# Patient Record
Sex: Female | Born: 1935
Health system: Southern US, Community
[De-identification: ages and names within clinical notes are randomized; demographics above are authoritative.]

## PROBLEM LIST (undated history)

## (undated) DIAGNOSIS — I10 Essential (primary) hypertension: Secondary | ICD-10-CM

## (undated) DIAGNOSIS — K589 Irritable bowel syndrome without diarrhea: Secondary | ICD-10-CM

## (undated) HISTORY — PX: CHOLECYSTECTOMY: SHX55

## (undated) HISTORY — PX: ABDOMINAL HYSTERECTOMY: SHX81

## (undated) HISTORY — PX: TONSILLECTOMY: SUR1361

## (undated) HISTORY — PX: OTHER SURGICAL HISTORY: SHX169

---

## 2005-09-20 ENCOUNTER — Encounter: Admission: RE | Admit: 2005-09-20 | Discharge: 2005-09-20 | Payer: Self-pay | Admitting: Family Medicine

## 2005-10-18 ENCOUNTER — Inpatient Hospital Stay (HOSPITAL_COMMUNITY): Admission: EM | Admit: 2005-10-18 | Discharge: 2005-10-22 | Payer: Self-pay | Admitting: Emergency Medicine

## 2005-10-19 ENCOUNTER — Ambulatory Visit: Payer: Self-pay | Admitting: Cardiology

## 2005-10-19 ENCOUNTER — Encounter: Payer: Self-pay | Admitting: Cardiology

## 2005-11-13 ENCOUNTER — Ambulatory Visit: Payer: Self-pay | Admitting: Internal Medicine

## 2006-01-08 ENCOUNTER — Ambulatory Visit: Payer: Self-pay | Admitting: Internal Medicine

## 2009-01-10 ENCOUNTER — Encounter: Admission: RE | Admit: 2009-01-10 | Discharge: 2009-01-10 | Payer: Self-pay | Admitting: Emergency Medicine

## 2010-05-30 ENCOUNTER — Ambulatory Visit: Payer: Self-pay | Admitting: Family Medicine

## 2010-05-30 DIAGNOSIS — I10 Essential (primary) hypertension: Secondary | ICD-10-CM

## 2010-05-30 DIAGNOSIS — J45909 Unspecified asthma, uncomplicated: Secondary | ICD-10-CM | POA: Insufficient documentation

## 2010-05-30 DIAGNOSIS — K589 Irritable bowel syndrome without diarrhea: Secondary | ICD-10-CM

## 2010-05-31 LAB — CONVERTED CEMR LAB
ALT: 14 units/L (ref 0–35)
AST: 22 units/L (ref 0–37)
BUN: 18 mg/dL (ref 6–23)
CO2: 26 meq/L (ref 19–32)
Calcium: 8.9 mg/dL (ref 8.4–10.5)
Chloride: 104 meq/L (ref 96–112)
Glucose, Bld: 68 mg/dL — ABNORMAL LOW (ref 70–99)
Total Bilirubin: 0.4 mg/dL (ref 0.3–1.2)
Total Protein: 6.3 g/dL (ref 6.0–8.3)

## 2010-06-23 ENCOUNTER — Ambulatory Visit
Admission: RE | Admit: 2010-06-23 | Discharge: 2010-06-23 | Payer: Self-pay | Source: Home / Self Care | Attending: Family Medicine | Admitting: Family Medicine

## 2010-07-16 ENCOUNTER — Encounter: Payer: Self-pay | Admitting: Emergency Medicine

## 2010-07-20 ENCOUNTER — Ambulatory Visit
Admission: RE | Admit: 2010-07-20 | Discharge: 2010-07-20 | Payer: Self-pay | Source: Home / Self Care | Attending: Family Medicine | Admitting: Family Medicine

## 2010-07-20 DIAGNOSIS — H8110 Benign paroxysmal vertigo, unspecified ear: Secondary | ICD-10-CM | POA: Insufficient documentation

## 2010-07-25 ENCOUNTER — Telehealth: Payer: Self-pay | Admitting: Family Medicine

## 2010-07-25 NOTE — Assessment & Plan Note (Signed)
Summary: NOV: Get estab   Vital Signs:  Karen Ball profile:   77 year old female Height:      62 inches Weight:      115 pounds Pulse rate:   75 / minute BP sitting:   157 / 51  (right arm) Cuff size:   regular  Vitals Entered By: Avon Gully CMA, Duncan Dull) (May 30, 2010 10:12 AM) CC: NP-est care, Hypertension Management   CC:  NP-est care and Hypertension Management.  History of Present Illness: Karen Ball is coming here to establish care.  Her previous physician left the practice that she was going to.  Her daughter is also a Karen Ball here. Still drives and balances her check book. Last blood work was a year ago. Has a problem with losing her sodium.  overall she is in good health.  She's been on her current medication regimen for several years without any changes.  she does have a history of asthma.  Overall she says she does very well with it.  She typically only has symptoms in the fall and spring.  She uses Provera as her rescue inhaler and Proventil as her preventative inhaler.  She said she has not had to use either inhaler in quite some time.  Hypertension History:      She denies headache, chest pain, palpitations, dyspnea with exertion, orthopnea, PND, peripheral edema, visual symptoms, neurologic problems, syncope, and side effects from treatment.  She notes no problems with any antihypertensive medication side effects.        Positive major cardiovascular risk factors include female age 59 years old or older and hypertension.  Negative major cardiovascular risk factors include non-tobacco-user status.     Habits & Providers  Alcohol-Tobacco-Diet     Alcohol drinks/day: 0     Tobacco Status: never  Exercise-Depression-Behavior     Does Karen Ball Exercise: yes     Type of exercise: aerobics     Exercise (avg: min/session): <30     Times/week: 6     STD Risk: never     Drug Use: never     Seat Belt Use: always  Current Medications (verified): 1)  Benicar 20 Mg Tabs  (Olmesartan Medoxomil) .... Take One By Mouth Qd 2)  Evista 60 Mg Tabs (Raloxifene Hcl) .... Take 1 By Mouth Once Daily 3)  Pulmicort Flexhaler 180 Mcg/act Aepb (Budesonide) 4)  Oxytrol 3.9 Mg/24hr Pttw (Oxybutynin) .... Apply One Patch 1-2 Times Weekly 5)  Tgt Aspirin 81 Mg Tbec (Aspirin) .... Take One Tablet By Mouth Daily 6)  Womens Multivitamin Plus  Tabs (Multiple Vitamins-Minerals)  Allergies (verified): No Known Drug Allergies  Comments:  Nurse/Medical Assistant: The Karen Ball's medications and allergies were reviewed with the Karen Ball and were updated in the Medication and Allergy Lists. Avon Gully CMA, Duncan Dull) (May 30, 2010 10:15 AM)  Past History:  Past Medical History: wears bilateral hearing aids  Past Surgical History: Hysterectomy Cholecystectomy tonsillectomy Cataracts, bilat.   Family History: unknown  Social History: Lives alone. Has 2 children.   Smoking Status:  never Does Karen Ball Exercise:  yes STD Risk:  never Drug Use:  never Seat Belt Use:  always  Review of Systems       No fever/sweats/weakness, unexplained weight loss/gain.  No vison changes.  + difficulty hearing/ringing in ears, hay fever/allergies.  No chest pain/discomfort, palpitations.  No Br lump/nipple discharge.  No cough/wheeze.  No blood in BM, nausea/vomiting/diarrhea.  No nighttime urination, leaking urine, unusual vaginal bleeding, discharge (penis  or vagina).  No muscle/joint pain. No rash, change in mole.  No HA, memory loss.  No anxiety, sleep d/o, depression.  No easy bruising/bleeding, unexplained lump   Physical Exam  General:  Well-developed,well-nourished,in no acute distress; alert,appropriate and cooperative throughout examination.  She appears younger than stated age. Head:  Normocephalic and atraumatic without obvious abnormalities. No apparent alopecia or balding. Eyes:  No corneal or conjunctival inflammation noted. EOMI. Perrla.  Neck:  No deformities,  masses, or tenderness noted.no thyromegaly Lungs:  Normal respiratory effort, chest expands symmetrically. Lungs are clear to auscultation, no crackles or wheezes. Heart:  Normal rate and regular rhythm. S1 and S2 normal without gallop, murmur, click, rub or other extra sounds.no carotid or abdominal bruits Abdomen:  Bowel sounds positive,abdomen soft and non-tender without masses, organomegaly or hernias noted. Pulses:  radial 2+ bilaterally Extremities:  no lower extremity edema Neurologic:  gait normal and DTRs symmetrical and normal.   Skin:  no rashes.   Cervical Nodes:  No lymphadenopathy noted Psych:  Cognition and judgment appear intact. Alert and cooperative with normal attention span and concentration. No apparent delusions, illusions, hallucinations   Impression & Recommendations:  Problem # 1:  HYPERTENSION, BENIGN (ICD-401.1) her blood pressure is a little elevated today.  She senses where she normally runs.  I am not going to adjust her medication as her diastolic blood pressures are in the leg.  She is to recheck her creatinine and potassium level. Her updated medication list for this problem includes:    Benicar 20 Mg Tabs (Olmesartan medoxomil) .Marland Kitchen... Take one by mouth qd  Orders: T-Comprehensive Metabolic Panel ([id number])  Problem # 2:  HYPONATREMIA (ICD-276.1) she does have a history of hyponatremia so I would like to recheck her baseline today. Orders: T-Comprehensive Metabolic Panel ([id number])  Problem # 3:  ASTHMA, UNSPECIFIED, UNSPECIFIED STATUS (ICD-493.90) her asthma stable and her inhalers are up-to-date. Her updated medication list for this problem includes:    Pulmicort Flexhaler 180 Mcg/act Aepb (Budesonide)    Proair Hfa 108 (90 Base) Mcg/act Aers (Albuterol sulfate) .Marland Kitchen... 2 puff eveyr 4 hours as needed shortness of breath  Complete Medication List: 1)  Benicar 20 Mg Tabs (Olmesartan medoxomil) .... Take one by mouth qd 2)  Evista 60 Mg Tabs  (Raloxifene hcl) .... Take 1 by mouth once daily 3)  Pulmicort Flexhaler 180 Mcg/act Aepb (Budesonide) 4)  Oxytrol 3.9 Mg/24hr Pttw (Oxybutynin) .... Apply one patch 1-2 times weekly 5)  Tgt Aspirin 81 Mg Tbec (Aspirin) .... Take one tablet by mouth daily 6)  Womens Multivitamin Plus Tabs (Multiple vitamins-minerals) 7)  Proair Hfa 108 (90 Base) Mcg/act Aers (Albuterol sulfate) .... 2 puff eveyr 4 hours as needed shortness of breath  Hypertension Assessment/Plan:      The Karen Ball's hypertensive risk group is category B: At least one risk factor (excluding diabetes) with no target organ damage.  Today's blood pressure is 157/51.    Karen Ball Instructions: 1)  Please schedule a follow-up appointment in 6 months for blood pressure and labs.  Prescriptions: EVISTA 60 MG TABS (RALOXIFENE HCL) take 1 by mouth once daily  #90 x 3   Entered and Authorized by:   Nani Gasser MD   Signed by:   Nani Gasser MD on 05/30/2010   Method used:   Printed then faxed to ...       [facility] (retail)             , [place]  Ph: [phone]       Fax: [phone]   RxID:   [id number] BENICAR 20 MG TABS (OLMESARTAN MEDOXOMIL) take one by mouth qd  #90 x 3   Entered and Authorized by:   Nani Gasser MD   Signed by:   Nani Gasser MD on 05/30/2010   Method used:   Printed then faxed to ...       [facility] (retail)             , [place]         Ph: [phone]       Fax: [phone]   RxID:   [id number] OXYTROL 3.9 MG/24HR PTTW (OXYBUTYNIN) apply one patch 1-2 times weekly  #4 x 11   Entered and Authorized by:   Nani Gasser MD   Signed by:   Nani Gasser MD on 05/30/2010   Method used:   Electronically to        [facility]* (retail)       [address] [place], [place]  [address]       Ph: [phone]       Fax: [phone]   RxID:   [id number]    Orders Added: 1)  New Karen Ball Level III [06237] 2)  T-Comprehensive  Metabolic Panel [[id number]]   Immunization History:  Influenza Immunization History:    Influenza:  historical (03/25/2010)  Pneumovax Immunization History:    Pneumovax:  historical (06/25/2008)   Immunization History:  Influenza Immunization History:    Influenza:  Historical (03/25/2010)  Pneumovax Immunization History:    Pneumovax:  Historical (06/25/2008)   Immunization History:  Influenza Immunization History:    Influenza:  historical (03/25/2010)  Pneumovax Immunization History:    Pneumovax:  historical (06/25/2008)

## 2010-07-27 NOTE — Letter (Signed)
Summary: Records Dated 05-16-07 thru 11-08-09/Wolters MD  Records Dated 05-16-07 thru 11-08-09/Wolters MD   Imported By: Lanelle Bal 07/03/2010 11:01:27  _____________________________________________________________________  External Attachment:    Type:   Image     Comment:   External Document

## 2010-07-27 NOTE — Assessment & Plan Note (Signed)
Summary: BPV, HTN   Vital Signs:  Patient profile:   77 year old female Height:      62 inches Weight:      117 pounds Pulse rate:   76 / minute BP sitting:   186 / 79  (right arm) Cuff size:   regular  Vitals Entered By: Avon Gully CMA, Duncan Dull) (July 20, 2010 3:15 PM) CC: cant keep balance,vertigo CBG Result 168   Primary Care Provider:  Nani Gasser MD  CC:  cant keep balance and vertigo.  History of Present Illness: cant keep balance,vertigo. Started at 2 yesterday AM. Today it is worse.  Today is nauseated.  Feels like when getting ready to move feels she will loose balance. If sitting still feels better. Vertigo is horizaontal and vertical.  Last episode of vertigo was years ago. Loose bowels for 3 days but this is not unusual. NO fever or cold sxs.  Took a xanax this AM. Took a maalox this afternoon. NO ear pain or pressure. Some ringing in her esars but this is chronic. She does wear hearing aids as well. She is worried because she has felt very thirsty today.  Current Medications (verified): 1)  Benicar 20 Mg Tabs (Olmesartan Medoxomil) .... Take One By Mouth Qd 2)  Evista 60 Mg Tabs (Raloxifene Hcl) .... Take 1 By Mouth Once Daily 3)  Pulmicort Flexhaler 180 Mcg/act Aepb (Budesonide) 4)  Oxytrol 3.9 Mg/24hr Pttw (Oxybutynin) .... Apply One Patch 1-2 Times Weekly 5)  Tgt Aspirin 81 Mg Tbec (Aspirin) .... Take One Tablet By Mouth Daily 6)  Womens Multivitamin Plus  Tabs (Multiple Vitamins-Minerals) 7)  Proair Hfa 108 (90 Base) Mcg/act Aers (Albuterol Sulfate) .... 2 Puff Eveyr 4 Hours As Needed Shortness of Breath  Allergies (verified): No Known Drug Allergies  Comments:  Nurse/Medical Assistant: The patient's medications and allergies were reviewed with the patient and were updated in the Medication and Allergy Lists. Avon Gully CMA, Duncan Dull) (July 20, 2010 3:15 PM)  Social History: Lives alone. Has 2 children.  Her daughter lives her in town.  Retired.   Physical Exam  General:  Well-developed,well-nourished,in no acute distress; alert,appropriate and cooperative throughout examination Head:  Normocephalic and atraumatic without obvious abnormalities. No apparent alopecia or balding. Eyes:  No corneal or conjunctival inflammation noted. EOMI. Perrla. Ears:  Right TM and canal are clear.  Left is blocked by cerumen. I tried to removed this wax but it was very hard and pt was having discomfort.  Nose:  External nasal examination shows no deformity or inflammation.  Mouth:  Oral mucosa and oropharynx without lesions or exudates.  Teeth in good repair. Neck:  No deformities, masses, or tenderness noted. Lungs:  Normal respiratory effort, chest expands symmetrically. Lungs are clear to auscultation, no crackles or wheezes. Heart:  Normal rate and regular rhythm. S1 and S2 normal without gallop, murmur, click, rub or other extra sounds. Pulses:  Radial 2+  Neurologic:  Dizziness with Diks-hallpike tothe left (but not true nystagmus). alert & oriented X3, cranial nerves II-XII intact, and gait normal.   Skin:  no rashes.   Cervical Nodes:  No lymphadenopathy noted Psych:  Cognition and judgment appear intact. Alert and cooperative with normal attention span and concentration. No apparent delusions, illusions, hallucinations   Impression & Recommendations:  Problem # 1:  BENIGN POSITIONAL VERTIGO (ICD-386.11) Assessment New  Discussed dx. H.O. given and im injection of phenergan given REviewed how to do the exercises given.   F/U in one  week to make sure she is improving.  Can also irrigate her ear at that time.  Her updated medication list for this problem includes:    Meclizine Hcl 25 Mg Tabs (Meclizine hcl) .Marland Kitchen... Take 1 tablet by mouth two times a day as needed for vertigo.;    Zofran 4 Mg Tabs (Ondansetron hcl) ..... One tab under your tongued every 6 hours as needed nausea.  Orders: Fingerstick (36416) Glucose, (CBG)  (04540) Promethazine up to 50mg  (J2550) Admin of Injection (IM/SQ) (98119)  Problem # 2:  HYPERTENSION, BENIGN (ICD-401.1) Assessment: Deteriorated  BP up today likely secondary to nausea. She did vomit twice in the office.  Will recheck in one week. She says home BPs were in teh 140s today.  Her updated medication list for this problem includes:    Benicar 20 Mg Tabs (Olmesartan medoxomil) .Marland Kitchen... Take one by mouth qd  BP today: 186/79 Prior BP: 159/72 (06/23/2010)  Prior 10 Yr Risk Heart Disease: Not enough information (05/30/2010)  Labs Reviewed: K+: 4.3 (05/30/2010) Creat: : 1.18 (05/30/2010)     Complete Medication List: 1)  Benicar 20 Mg Tabs (Olmesartan medoxomil) .... Take one by mouth qd 2)  Evista 60 Mg Tabs (Raloxifene hcl) .... Take 1 by mouth once daily 3)  Pulmicort Flexhaler 180 Mcg/act Aepb (Budesonide) 4)  Oxytrol 3.9 Mg/24hr Pttw (Oxybutynin) .... Apply one patch 1-2 times weekly 5)  Tgt Aspirin 81 Mg Tbec (Aspirin) .... Take one tablet by mouth daily 6)  Womens Multivitamin Plus Tabs (Multiple vitamins-minerals) 7)  Proair Hfa 108 (90 Base) Mcg/act Aers (Albuterol sulfate) .... 2 puff eveyr 4 hours as needed shortness of breath 8)  Meclizine Hcl 25 Mg Tabs (Meclizine hcl) .... Take 1 tablet by mouth two times a day as needed for vertigo.; 9)  Zofran 4 Mg Tabs (Ondansetron hcl) .... One tab under your tongued every 6 hours as needed nausea.  Patient Instructions: 1)  Stay hydrated. Can use the zolfran if very nauseated 2)  The Meclizine is for dizziness as well.  3)  Also start the exercises tonight if able.  4)  Follow up in one week for vertigo, to wash you ear out and for your blood pressure.  Prescriptions: ZOFRAN 4 MG TABS (ONDANSETRON HCL) one tab under your tongued every 6 hours as needed nausea.  #8 x 0   Entered and Authorized by:   Nani Gasser MD   Signed by:   Nani Gasser MD on 07/20/2010   Method used:   Electronically to         [facility] (retail)       [address] [place], [place]  [address]       Ph: [phone]       Fax: [phone]   RxID:   [id number] MECLIZINE HCL 25 MG TABS (MECLIZINE HCL) Take 1 tablet by mouth two times a day as needed for vertigo.;  #20 x 0   Entered and Authorized by:   Nani Gasser MD   Signed by:   Nani Gasser MD on 07/20/2010   Method used:   Electronically to        [facility] (retail)       [address] [place], [place]  [address]       Ph: [phone]       Fax: [phone]   RxID:   [id number]    Medication  Administration  Injection # 1:    Medication: Promethazine up to 50mg     Diagnosis: BENIGN POSITIONAL VERTIGO (ICD-386.11)    Route: IM    Site: RUOQ gluteus    Exp Date: 01/24/2012    Lot #: [id number]    Mfr: [place]    Given by: Avon Gully CMA, Duncan Dull) (July 21, 2010 8:27 AM)  Orders Added: 1)  Fingerstick [36416] 2)  Glucose, (CBG) [82962] 3)  Est. Patient Level IV [46962] 4)  Promethazine up to 50mg  [J2550] 5)  Admin of Injection (IM/SQ) [95284] 6)  Est. Patient Level IV [13244]    Laboratory Results   Blood Tests   Date/Time Received: 07/20/10 Date/Time Reported: 07/20/10  CBG Random:: 168mg /dL

## 2010-07-27 NOTE — Assessment & Plan Note (Signed)
Summary: Sinsusitis   Vital Signs:  Patient profile:   77 year old female Height:      62 inches Weight:      115 pounds Temp:     98.6 degrees F oral Pulse rate:   56 / minute BP sitting:   159 / 72  (right arm) Cuff size:   regular  Vitals Entered By: Avon Gully CMA, Duncan Dull) (June 23, 2010 4:23 PM) CC: cough adn sinus drainagecough x 2 weeks   CC:  cough adn sinus drainagecough x 2 weeks.  History of Present Illness: cough adn sinus drainage cough x 2 weeks. No HA or facial pressure.  Just alot ofthick mucous from her nose and throat.  Some mild nosebleeds. No ear pain ore pressure. No GI sxs.  Using cough drops.  Worse when first lays down at night.  Then takes a cough drop and it quiets down for the night. Using nasal saline.   Current Medications (verified): 1)  Benicar 20 Mg Tabs (Olmesartan Medoxomil) .... Take One By Mouth Qd 2)  Evista 60 Mg Tabs (Raloxifene Hcl) .... Take 1 By Mouth Once Daily 3)  Pulmicort Flexhaler 180 Mcg/act Aepb (Budesonide) 4)  Oxytrol 3.9 Mg/24hr Pttw (Oxybutynin) .... Apply One Patch 1-2 Times Weekly 5)  Tgt Aspirin 81 Mg Tbec (Aspirin) .... Take One Tablet By Mouth Daily 6)  Womens Multivitamin Plus  Tabs (Multiple Vitamins-Minerals) 7)  Proair Hfa 108 (90 Base) Mcg/act Aers (Albuterol Sulfate) .... 2 Puff Eveyr 4 Hours As Needed Shortness of Breath  Allergies (verified): No Known Drug Allergies  Comments:  Nurse/Medical Assistant: The patient's medications and allergies were reviewed with the patient and were updated in the Medication and Allergy Lists. Avon Gully CMA, Duncan Dull) (June 23, 2010 4:23 PM)  Physical Exam  General:  Well-developed,well-nourished,in no acute distress; alert,appropriate and cooperative throughout examination Head:  Normocephalic and atraumatic without obvious abnormalities. No apparent alopecia or balding. Eyes:  No corneal or conjunctival inflammation noted. EOMI. Perrla.  Ears:  External ear  exam shows no significant lesions or deformities.  Otoscopic examination reveals clear canals, tympanic membranes are intact bilaterally without bulging, retraction, inflammation or discharge. Hearing is grossly normal bilaterally. Nose:  External nasal examination shows no deformity or inflammation. Nasal mucosa are pink and moist without lesions or exudates. Mouth:  Oral mucosa and oropharynx without lesions or exudates.   Neck:  No deformities, masses, or tenderness noted. Lungs:  Normal respiratory effort, chest expands symmetrically. Lungs are clear to auscultation, no crackles or wheezes. Heart:  Normal rate and regular rhythm. S1 and S2 normal without gallop, murmur, click, rub or other extra sounds. Skin:  no rashes.   Cervical Nodes:  No lymphadenopathy noted Psych:  Cognition and judgment appear intact. Alert and cooperative with normal attention span and concentration. No apparent delusions, illusions, hallucinations   Impression & Recommendations:  Problem # 1:  SINUSITIS - ACUTE-NOS (ICD-461.9) Can use Delsym and nasal saline for her sxs. Can start the abx and call if not better in one week.  Her updated medication list for this problem includes:    Amoxicillin 875 Mg Tabs (Amoxicillin) .Marland Kitchen... Take 1 tablet by mouth two times a day for 10 days  Instructed on treatment. Call if symptoms persist or worsen.   Complete Medication List: 1)  Benicar 20 Mg Tabs (Olmesartan medoxomil) .... Take one by mouth qd 2)  Evista 60 Mg Tabs (Raloxifene hcl) .... Take 1 by mouth once daily 3)  Pulmicort Flexhaler  180 Mcg/act Aepb (Budesonide) 4)  Oxytrol 3.9 Mg/24hr Pttw (Oxybutynin) .... Apply one patch 1-2 times weekly 5)  Tgt Aspirin 81 Mg Tbec (Aspirin) .... Take one tablet by mouth daily 6)  Womens Multivitamin Plus Tabs (Multiple vitamins-minerals) 7)  Proair Hfa 108 (90 Base) Mcg/act Aers (Albuterol sulfate) .... 2 puff eveyr 4 hours as needed shortness of breath 8)  Amoxicillin 875 Mg  Tabs (Amoxicillin) .... Take 1 tablet by mouth two times a day for 10 days  Patient Instructions: 1)  Call if not better in one week. Stay hydrated.  Prescriptions: AMOXICILLIN 875 MG TABS (AMOXICILLIN) Take 1 tablet by mouth two times a day for 10 days  #20 x 0   Entered and Authorized by:   Nani Gasser MD   Signed by:   Nani Gasser MD on 06/23/2010   Method used:   Electronically to        [facility] (retail)       [address] [place], [place]  [address]       Ph: [phone]       Fax: [phone]   RxID:   [id number]    Orders Added: 1)  Est. Patient Level III [84132] 2)  Est. Patient Level III [44010]

## 2010-07-31 ENCOUNTER — Ambulatory Visit: Payer: Self-pay | Admitting: Family Medicine

## 2010-08-01 ENCOUNTER — Ambulatory Visit: Payer: Self-pay | Admitting: Family Medicine

## 2010-08-02 NOTE — Progress Notes (Signed)
Summary: update on vertigo  ---- Converted from flag ---- ---- 07/21/2010 1:18 PM, Nani Gasser MD wrote: Will you call her and see if her vertigo is any better. ------------------------------  07/25/10 11:12 acm  called and left message on vm to call back with update 11:23 AM July 25, 2010 McCrim[place] CMA, Duncan Dull), Sue Lush pt's daughter called back and states she is feeling better, no nausea or vomiting or dizziness but feels very tired and weak but has good appetite

## 2010-08-03 ENCOUNTER — Ambulatory Visit (INDEPENDENT_AMBULATORY_CARE_PROVIDER_SITE_OTHER): Payer: MEDICARE | Admitting: Family Medicine

## 2010-08-03 ENCOUNTER — Encounter: Payer: Self-pay | Admitting: Family Medicine

## 2010-08-03 DIAGNOSIS — L259 Unspecified contact dermatitis, unspecified cause: Secondary | ICD-10-CM | POA: Insufficient documentation

## 2010-08-03 DIAGNOSIS — H6120 Impacted cerumen, unspecified ear: Secondary | ICD-10-CM | POA: Insufficient documentation

## 2010-08-03 DIAGNOSIS — H8110 Benign paroxysmal vertigo, unspecified ear: Secondary | ICD-10-CM

## 2010-08-03 DIAGNOSIS — K589 Irritable bowel syndrome without diarrhea: Secondary | ICD-10-CM

## 2010-08-10 NOTE — Assessment & Plan Note (Signed)
Summary: IBS, vertigo, rash, etc   Vital Signs:  Patient profile:   77 year old female Height:      62 inches Weight:      113 pounds Pulse rate:   84 / minute BP sitting:   142 / 70  (right arm) Cuff size:   regular  Vitals Entered By: Avon Gully CMA, Duncan Dull) (August 03, 2010 3:32 PM) CC: f/u vertigo, improved   Primary Care Provider:  Nani Gasser MD  CC:  f/u vertigo and improved.  History of Present Illness: f/u vertigo, improved. Vertigo has resolved.  Says still has some meds left. Want her ears washed out today.   IBS is what really upsets her.  Had multiple BMs yesterday. Says they are not loose or actual diarrhea just very frequent.  Then feels like everything races inside her after several BMs.  Pepto-Bismol helps her.  ONly 3 BMs today.  Sometimes Pepto BIsmol constipates.  Has had similar sxs for years. No real change.  Wants to make sure it is safe to use Pepto Bismol as needed .   She did bring in her copy of her home BPS.  She Didn't bring in home cuff.    Has a rash on the right side of her nose. Says it comes and goes.  Occ tender but not itching.  Usually resolves on its own but always returns on the same side. HAs been using hydrocortisone on it and says that usually gets it to resovle.   Her grandaughter is here with her today.   Current Medications (verified): 1)  Benicar 20 Mg Tabs (Olmesartan Medoxomil) .... Take One By Mouth Qd 2)  Evista 60 Mg Tabs (Raloxifene Hcl) .... Take 1 By Mouth Once Daily 3)  Pulmicort Flexhaler 180 Mcg/act Aepb (Budesonide) 4)  Oxytrol 3.9 Mg/24hr Pttw (Oxybutynin) .... Apply One Patch 1-2 Times Weekly 5)  Tgt Aspirin 81 Mg Tbec (Aspirin) .... Take One Tablet By Mouth Daily 6)  Womens Multivitamin Plus  Tabs (Multiple Vitamins-Minerals) 7)  Proair Hfa 108 (90 Base) Mcg/act Aers (Albuterol Sulfate) .... 2 Puff Eveyr 4 Hours As Needed Shortness of Breath 8)  Meclizine Hcl 25 Mg Tabs (Meclizine Hcl) .... Take 1 Tablet  By Mouth Two Times A Day As Needed For Vertigo.; 9)  Zofran 4 Mg Tabs (Ondansetron Hcl) .... One Tab Under Your Tongued Every 6 Hours As Needed Nausea.  Allergies (verified): No Known Drug Allergies  Comments:  Nurse/Medical Assistant: The patient's medications and allergies were reviewed with the patient and were updated in the Medication and Allergy Lists. Avon Gully CMA, Duncan Dull) (August 03, 2010 3:34 PM)  Past History:  Past Medical History: Last updated: 05/30/2010 wears bilateral hearing aids  Social History: Last updated: 07/20/2010 Lives alone. Has 2 children.  Her daughter lives her in town. Retired.   Physical Exam  General:  Well-developed,well-nourished,in no acute distress; alert,appropriate and cooperative throughout examination Head:  Normocephalic and atraumatic without obvious abnormalities. No apparent alopecia or balding. Eyes:  No corneal or conjunctival inflammation noted. EOMI. Perrla. Ears:  Left TM blocked by cerumen.  After irriation adn manual disimpaction the canal was cleared and the upper portion of her TM looks normal. Right TM and canal were clear.  Lungs:  Normal respiratory effort, chest expands symmetrically. Lungs are clear to auscultation, no crackles or wheezes. Heart:  Normal rate and regular rhythm. S1 and S2 normal without gallop, murmur, click, rub or other extra sounds. Skin:  3 erythematous  papules on teh right side of nose.     Impression & Recommendations:  Problem # 1:  BENIGN POSITIONAL VERTIGO (ICD-386.11) REsolved.   Can start her exercises if sxs return.  The following medications were removed from the medication list:    Meclizine Hcl 25 Mg Tabs (Meclizine hcl) .Marland Kitchen... Take 1 tablet by mouth two times a day as needed for vertigo.; Her updated medication list for this problem includes:    Zofran 4 Mg Tabs (Ondansetron hcl) ..... One tab under your tongued every 6 hours as needed nausea.  Problem # 2:  IBS  (ICD-564.1) Discussed eating more gradually to increase consistancy. If patterns out of the norm the norm then she need to call our office and we can refer to GI.  Also let her know occ use of Pepto Bismol is OK as long as not using daily.    Problem # 3:  HYPERTENSION, BENIGN (ICD-401.1)  Much better today. Home BPs range 102-190/ 49-64. Continue current regimen. I worry about dripping her BP too low though she really has large swings in her pressure.  Her updated medication list for this problem includes:    Benicar 20 Mg Tabs (Olmesartan medoxomil) .Marland Kitchen... Take one by mouth qd  BP today: 154/72 Prior BP: 186/79 (07/20/2010)  Prior 10 Yr Risk Heart Disease: Not enough information (05/30/2010)  Labs Reviewed: K+: 4.3 (05/30/2010) Creat: : 1.18 (05/30/2010)     Problem # 4:  DERMATITIS (ICD-692.9) The rash on her nose looks some like rosacea thought would be string to only be on one side of nose. Offered tx with metronidazole topical gel but pt preferred to use her hydrocortisone.  I just reminded her not to use for more than 2 weeks in a row, as needs to give her skin a break.  Prolonged use of steroids can cause acne like breakouts.   Problem # 5:  CERUMEN IMPACTION, LEFT (ICD-380.4) manually disimpacted and irrigated. Discussed using peroxide diluted with water twice a week to help soften the wax.   Complete Medication List: 1)  Benicar 20 Mg Tabs (Olmesartan medoxomil) .... Take one by mouth qd 2)  Evista 60 Mg Tabs (Raloxifene hcl) .... Take 1 by mouth once daily 3)  Pulmicort Flexhaler 180 Mcg/act Aepb (Budesonide) 4)  Oxytrol 3.9 Mg/24hr Pttw (Oxybutynin) .... Apply one patch 1-2 times weekly 5)  Tgt Aspirin 81 Mg Tbec (Aspirin) .... Take one tablet by mouth daily 6)  Womens Multivitamin Plus Tabs (Multiple vitamins-minerals) 7)  Proair Hfa 108 (90 Base) Mcg/act Aers (Albuterol sulfate) .... 2 puff eveyr 4 hours as needed shortness of breath 8)  Zofran 4 Mg Tabs (Ondansetron hcl)  .... One tab under your tongued every 6 hours as needed nausea. 9)  Pepto-bismol 262 Mg Tabs (Bismuth subsalicylate)  Patient Instructions: 1)  Please schedule a follow-up appointment in 3 months for blood pressure .    Orders Added: 1)  Est. Patient Level IV [16109]

## 2010-11-10 NOTE — H&P (Signed)
NAME:  MAKINLEY, MUSCATO NO.:  [id number]   MEDICAL RECORD NO.:  [id number]          PATIENT TYPE:  EMS   LOCATION:  MAJO                         FACILITY:  [facility]   PHYSICIAN:  Sherin Quarry, MD      DATE OF BIRTH:  Oct 31, 1917   DATE OF ADMISSION:  10/18/2005  DATE OF DISCHARGE:                                HISTORY & PHYSICAL   Karen Ball is an 77 year old lady who was formerly a resident of  [place].  She moved to [facility] to be close to her daughter in  October of last year.  According to the patient's daughter, about two months  ago, she had an episode of coughing and respiratory illness that was  attributed to the flu.  After that, she seemed to change in terms of her  personality, becoming increasing anxious, crying frequently, and exhibiting  a generalized tremor.  Over the last couple of weeks, her daughter has noted  increased ankle edema.  Today, she awakened complaining of nausea and said  that her chest felt heavy.  She also had a burning feeling in her throat.  She vomited on one occasion and had three loose stools.  She has vomited  again since she has been in the emergency room.   In the emergency room, her blood pressure was 144/46.  The pulse was 100.   Laboratory studies obtained included a hemoglobin of 12.1.  She was noted to  have a sodium of 115.   She is admitted at this time because of persistent nausea and vomiting as  well as for treatment of the hyponatremia.  She denies headache.  She is not  short of breath.  She does not have any abdominal pain.  She is not having  any dysuria.  She has not had any fevers or chills.   CURRENT MEDICATIONS:  1.  Lisinopril 10 mg daily.  2.  Norvasc 10 mg daily.  3.  Hydrochlorothiazide 25 mg daily.  4.  Xanax 0.25 mg p.r.n.  5.  Zoloft 25 mg daily.   She is allergic to IVP DYE.   PAST SURGICAL HISTORY:  She has had a previous cholecystectomy and cataract  operation.   PAST MEDICAL  HISTORY:  Hypertension.  Apparently, hypertension has been  diagnosed over the last three months.  She has gradually been placed on  increasing doses of blood pressure medicine.  Most recently,  hydrochlorothiazide, which was started a few days ago.  Her other medicines  are as detailed above.  Otherwise, she apparently has no history of heart  disease, stroke, COPD, kidney problems, etc.   FAMILY HISTORY:  Unremarkable.  The patient has a sister and a brother who  are both thought to be in good health.   SOCIAL HISTORY:  She lives by herself here in [place], but her daughter  lives close by.  She does not smoke.  She does not use alcohol.  Her  daughter thinks that she is quite anxious in general, possibly because of  the effects of moving to an unfamiliar location.   REVIEW  OF SYSTEMS:  Otherwise negative.   PHYSICAL EXAMINATION:  VITAL SIGNS:  Blood pressure 144/46, pulse 100,  respirations 24.  GENERAL:  The patient is a very anxious woman who has a generalized distal  tremor and is complaining of nausea.  HEENT:  Within normal limits.  CHEST:  Clear.  CARDIOVASCULAR:  Normal S1 and S2 without murmurs, rubs, or gallops.  ABDOMEN:  Benign.  Normal bowel sounds.  There are no masses, tenderness,  guarding or rebound.  NEUROLOGIC:  Remarkable only for the above-mentioned tremor.  EXTREMITIES:  Pretibial edema 1+.  The patient's daughter states that her  peripheral edema is much decreased from the last time that she saw Dr.  Julian Reil.   IMPRESSION:  1.  Persistent nausea and diarrhea.  2.  Hyponatremia.  3.  Hypertension.  4.  Increased anxiety and tremor x3 months.  5.  History of cholecystectomy.   PLAN:  We will give the patient intravenous fluids in the form of normal  saline and hold the hydrochlorothiazide.  We will follow her BMET closely.  In light of her complaints of chest pressure, we will obtain serial cardiac  enzymes to rule out MI.  In light of her recent  onset of tremor, we will  obtain a thyroid profile.  We will continue her anti-anxiety medications.  A  chest x-ray and KUB will be obtained.  Because of the hyponatremia, I am  going to check a cortisol level.  Will follow her closely in a hospital  setting.  This was discussed with her daughter.           ______________________________  Sherin Quarry, MD     SY/MEDQ  D:  10/18/2005  T:  10/18/2005  Job:  [id number]   cc:   Schuyler Amor, M.D.  Fax: [phone]

## 2010-11-10 NOTE — Assessment & Plan Note (Signed)
[facility]                               PULMONARY OFFICE NOTE   Karen Ball, Karen Ball                          MRN:          [id number]  DATE:01/08/2006                            DOB:          01-25-1918    PULMONARY/EXTENDED SUMMARY FOLLOWUP   HISTORY:  This is an 77 year old white female, who states she has had asthma  for years and never a smoker, was seen on May 22nd with difficulty  symptoms of dyspnea and cough that totally resolved off of ACE inhibitors  and also off of all asthma treatment.  She says in retrospect her problems  are worse in the spring and the fall, and of course it is the middle of the  summer and she is having no symptoms whatsoever.  Specifically, she denies  any nocturnal wheezing or cough or any nocturnal respiratory disturbances,  or significant dyspnea with activities of daily living, although she is  certainly not aerobically active.   Her medications were reviewed with her in detailed inventory.  The face  sheet today, January 08, 2006, no longer include ACE inhibitors.   PHYSICAL EXAMINATION:  VITAL SIGNS:  Her blood pressure is 130/74 with a  pulse rate of only 61 after taking her Benicar this morning.  GENERAL:  She is all smiles.  LUNGS:  Lung fields are perfectly clear bilaterally to auscultation and  percussion.  HEART:  Regular rate and rhythm with murmur, gallop, rub.  ABDOMEN:  Soft, benign.  EXTREMITIES:  Warm without calf tenderness, cyanosis, clubbing, edema.   PFTs are reviewed with her and her daughter and are basically normal.   IMPRESSION:  1.  Complete resolution of all of her asthma symptoms off of ACE      inhibitors and on Benicar.  2.  Benicar 20/12.5 one-half daily.  3.  I emphasized to her that this does not mean she does not have asthma.      In fact, during the spring and the fall, it may well be she is going to      notice increased symptoms of cough or wheeze for which she needs to be      able to use Albuterol p.r.n.  If she breaks the Rule of Twos of      Albuterol treatment, she needs to be maintained back on Azmacort during      her seasons but at this point I was unable to make a specific      recommendation as to when to start it and stop it, and would ask her to      monitor Albuterol use as an indicator whether Azmacort is needed.   I spent extra time going over all of these issues in as much detail as I  could for the patient and her daughter to summarize how difficult it is  sometimes to make a diagnosis of asthma and other problems, such as ACE  inhibitor intolerance, can easily be confused with typical asthma symptoms  and sometimes are indistinguishable unless using a trial and error  approach as was  done here.   I would be happy to see her back if on the other hand she notices increasing  symptoms off of ACE inhibitors, which are not easily controlled with  Azmacort with p.r.n. Albuterol, and I gave her specific scenarios under  which she should see me.  Otherwise, all of her care will be directed back  by her primary physicians at [facility].                                   Charlaine Dalton. Sherene Sires, MD, [facility]   MBW/MedQ  DD:  01/08/2006  DT:  01/08/2006  Job #:  [id number]   cc:   Schuyler Amor, MD

## 2010-11-10 NOTE — Discharge Summary (Signed)
[name], Ball NO.:  [id number]   MEDICAL RECORD NO.:  [id number]          PATIENT TYPE:  INP   LOCATION:  4707                         FACILITY:  [facility]   PHYSICIAN:  Jackie Plum, M.D.DATE OF BIRTH:  1917/10/16   DATE OF ADMISSION:  10/18/2005  DATE OF DISCHARGE:  10/22/2005                                 DISCHARGE SUMMARY   DISCHARGE DIAGNOSES:  1.  Nausea and diarrhea, resolved.  2.  Recurrent hyponatremia significantly improved (outpatient follow-up      recommended).  3.  Chronic obstructive pulmonary disease by chest x-ray.  4.  Questionable history of asthma.  5.  History of anxiety and tremors.  6.  Mild anemia, stable.  Outpatient follow-up recommended.   DISCHARGE MEDICATIONS:  The patient is going home to resume:  1.  Norvasc 10 mg daily.  2.  Lisinopril has been increased to 20 mg daily.  3.  Hydrochlorothiazide has been discontinued.  4.  She also continues her Azmacort as previously.  5.  She will also continue her Evista as previously.   CONSULTATIONS:  Not applicable.   PROCEDURE:  Not applicable.   CONDITION ON DISCHARGE:  Improved and satisfactory.   REASON FOR ADMISSION:  Severe hyponatremia.  The patient is a very pleasant  77 year old Caucasian lady who moved to [facility] to be close to her  daughter in October of last year and has been seen by Dr. Julian Reil of [facility].  She presents with increasing worsening of nausea and  weakness.  She also had some loose stools and vomited.  She was also noted  to have some generalized tremor at that time.  On admission, the patient was  noted to have very severe hyponatremia of 101.5.  Her HEENT is within normal  limits with normal cardiopulmonary auscultation.  She had some tremors,  otherwise neurologic testing was unremarkable.  She had 1+ pretibial edema.  On admission, the patient was started on slow IV fluid with normal saline  infusion.  Her hydrochlorothiazide  was held on account of concerns that this  may be contributing to her hyponatremia.  Her basic metabolic panels were  followed carefully and became negative for a myocardial infarction.  TSH was  checked which was within normal limits.  Cortisol level was also within  normal limits.  The patient's sodium increased to 118 and subsequently to  134 which remained so the next day with improvement and resolution of her  presenting symptoms.  After further discussion with the family, we realized  that the patient has had problems with sodium for a long while she was in  [place] and apparently has been investigated previously.  The patient  was seen also by PT and she did very well.  The patient was planned for  discharge home today.  On rounds today, the patient denies nausea, vomiting,  diarrhea, abdominal pain, dizziness, chest pain, shortness of breath, or any  fatigue.  Her blood pressure was 157/71, pulse 87, respirations 20,  temperature 98.7 degrees Fahrenheit, O2 saturation of 93% on room air.  She  is alert, appropriate, and pleasant.  Oropharynx moist with no evidence of  clinical dehydration.  Cardiopulmonary examination unremarkable.  Abdomen  was soft and nontender.  Extremities with no cyanosis.  She is being  discharged home for outpatient follow-up with her PCP in about 10-14 days.  At the outpatient follow-up visit, the patient will need to get metabolic  panel checked to follow up on her sodium to make sure the levels are in  acceptable range.  I have asked them to go ahead and get the patient's  medical records forwarded from previous PCP in [place] to Dr. Julian Reil  at [facility] and would need to find out if any workup has been  done for this previously and exactly what she has and she will need  continued follow-up of her sodium levels.  Also, the patient had mentioned a  history of asthma and never knew of any history of COPD, however, her x-ray  in the  hospital done on admission specifically October 18, 2005, indicated  COPD without any active disease.  We will need to look into previous  diagnosis of COPD or asthma and if this cannot be specifically verified,  then the patient may benefit from pulmonary function testing.  She has been  taking Azmacort which is good for her.  We tried her on Combivent, but she  got very anxious with that and therefore, we decided not to prescribe this  for her at this time since she's  fine, but this is something that needs to  be looked into during her outpatient visit.  The patient is discharged in  stable satisfactory condition.      Jackie Plum, M.D.  Electronically Signed     GO/MEDQ  D:  10/22/2005  T:  10/22/2005  Job:  [id number]   cc:   Schuyler Amor, M.D.  Fax: [phone]

## 2011-04-13 ENCOUNTER — Encounter: Payer: Self-pay | Admitting: Family Medicine

## 2011-04-18 ENCOUNTER — Ambulatory Visit (INDEPENDENT_AMBULATORY_CARE_PROVIDER_SITE_OTHER): Payer: Medicare Other | Admitting: Family Medicine

## 2011-04-18 VITALS — BP 158/70 | HR 63 | Wt 115.0 lb

## 2011-04-18 DIAGNOSIS — R946 Abnormal results of thyroid function studies: Secondary | ICD-10-CM

## 2011-04-18 DIAGNOSIS — E871 Hypo-osmolality and hyponatremia: Secondary | ICD-10-CM

## 2011-04-18 DIAGNOSIS — K5289 Other specified noninfective gastroenteritis and colitis: Secondary | ICD-10-CM

## 2011-04-18 DIAGNOSIS — K529 Noninfective gastroenteritis and colitis, unspecified: Secondary | ICD-10-CM

## 2011-04-18 NOTE — Progress Notes (Signed)
  Subjective:    Patient ID: Karen Ball, female    DOB: 1918/04/08, 77 y.o.   MRN: [id number]  HPI She is here for hospital followup today. She said about 2 weeks ago he she started having frequent stools. This was typical for her IBS. She says that it was not diarrhea there were well-formed but she had about 8 bowel movements during the day. Later that evening she vomited and went to the emergency department. They gave her IV hydration and discharged her home. She started vomiting again in the car on the way home. 2 days later she ended up going back to the emergency room after continuing to vomit and was admitted overnight. She was rehydrated as she was hyponatremic on admission. Since then she has been feeling much better. She says she still feels a little queasy but has not vomited. Her appetite is slowly improving. She's remained afebrile. In addition they noted that her TSH was borderline elevated per the report. I recommended rechecking her when she was feeling well. I did review her discharge summary. She has had multiple episodes of frequent stools and hyponatremia.    Review of Systems     Objective:   Physical Exam  Constitutional: She is oriented to person, place, and time. She appears well-developed and well-nourished.  HENT:  Head: Normocephalic and atraumatic.  Cardiovascular: Normal rate, regular rhythm and normal heart sounds.   Pulmonary/Chest: Effort normal and breath sounds normal.  Abdominal: Bowel sounds are normal. She exhibits no distension.  Neurological: She is alert and oriented to person, place, and time.  Skin: Skin is warm and dry.  Psychiatric: She has a normal mood and affect. Her behavior is normal.          Assessment & Plan:  Gastroenteritis-she seems to be improving. I would like to recheck her sodium today to make sure that she is stable. She was also given Zofran to use for nausea and vomiting at discharge and see says she has not had to use this  yet.  Abnormal thyroid level-recheck TSH today.  She is also at the Medicare gap and that we gave her samples of Benicar and Pulmicort today to help.

## 2011-04-19 ENCOUNTER — Telehealth: Payer: Self-pay | Admitting: *Deleted

## 2011-04-19 LAB — BASIC METABOLIC PANEL
BUN: 12 mg/dL (ref 6–23)
Calcium: 9.2 mg/dL (ref 8.4–10.5)
Creat: 1.24 mg/dL — ABNORMAL HIGH (ref 0.50–1.10)

## 2011-04-19 NOTE — Telephone Encounter (Signed)
Message copied by Wyline Beady on Thu Apr 19, 2011  9:26 AM ------      Message from: Nani Gasser D      Created: Thu Apr 19, 2011  6:32 AM       Her sodium is just borderline low. That's much better than when she was in the hospital. I encourage her to eat some salty foods and let's recheck in one week to make sure it is stable. Her thyroid is in the normal range. We can recheck in 6 months to make sure it is stable.

## 2011-04-19 NOTE — Telephone Encounter (Signed)
Left message with son-in law

## 2011-04-25 ENCOUNTER — Telehealth: Payer: Self-pay | Admitting: Family Medicine

## 2011-04-25 MED ORDER — ALPRAZOLAM 0.5 MG PO TABS: ORAL_TABLET | ORAL | Status: DC

## 2011-04-25 NOTE — Telephone Encounter (Signed)
Prescription printed. Will fax.

## 2011-04-25 NOTE — Telephone Encounter (Signed)
Pt returned call to our office from last week regarding her lab results.  Pt also said at her appt last week she and Dr. Linford Arnold had discussed med for her nerves and she had to rush down to the lab before they closed and she forgot to get a script for the nerve med (alprazolam). Plan:  Lab results were addressed with the pt and she understands to repeat in 6 mths.           Sent encounter to Dr. Linford Arnold to order nerve med since pt has not been on, and correct pharm is loaded in chart file.  Jarvis Newcomer, LPN [facility]

## 2011-05-15 ENCOUNTER — Encounter: Payer: Self-pay | Admitting: *Deleted

## 2011-05-15 ENCOUNTER — Emergency Department (INDEPENDENT_AMBULATORY_CARE_PROVIDER_SITE_OTHER)
Admission: EM | Admit: 2011-05-15 | Discharge: 2011-05-15 | Disposition: A | Discharge status: Home / Self Care | Payer: Medicare Other | Source: Home / Self Care | Attending: Emergency Medicine | Admitting: Emergency Medicine

## 2011-05-15 DIAGNOSIS — R300 Dysuria: Secondary | ICD-10-CM

## 2011-05-15 DIAGNOSIS — N390 Urinary tract infection, site not specified: Secondary | ICD-10-CM

## 2011-05-15 DIAGNOSIS — J069 Acute upper respiratory infection, unspecified: Secondary | ICD-10-CM

## 2011-05-15 DIAGNOSIS — J029 Acute pharyngitis, unspecified: Secondary | ICD-10-CM

## 2011-05-15 HISTORY — DX: Essential (primary) hypertension: I10

## 2011-05-15 LAB — POCT URINALYSIS DIPSTICK
Ketones, UA: NEGATIVE
Nitrite, UA: POSITIVE
Urobilinogen, UA: 0.2 (ref 0–1)
pH, UA: 7 (ref 5–8)

## 2011-05-15 LAB — POCT RAPID STREP A (OFFICE): Rapid Strep A Screen: NEGATIVE

## 2011-05-15 MED ORDER — CIPROFLOXACIN HCL 500 MG PO TABS: 500.0000 mg | ORAL_TABLET | Freq: Two times a day (BID) | ORAL | Status: AC

## 2011-05-15 NOTE — ED Provider Notes (Signed)
History     CSN: [id number] Arrival date & time: 05/15/2011 11:54 AM   First MD Initiated Contact with Patient 05/15/11 1148      No chief complaint on file.   (Consider location/radiation/quality/duration/timing/severity/associated sxs/prior treatment) HPI 1) This is a 77 y.o. female who presents today with UTI symptoms for a few weeks, intermittently better then worse.  + dysuria + frequency (baseline) No urgency No hematuria No vaginal discharge No fever/chills + lower abdominal pain + back pain No fatigue    2) She also complains of onset of cold symptoms for 1 days.  + sore throat (improving) No cough No pleuritic pain No wheezing No nasal congestion No post-nasal drainage No sinus pain/pressure No chest congestion No itchy/red eyes No earache No hemoptysis No SOB No chills/sweats No fever No nausea No vomiting No abdominal pain No diarrhea No skin rashes No fatigue No myalgias No headache     Past Medical History  Diagnosis Date  . Hearing loss     bilateral hearing aids    Past Surgical History  Procedure Date  . Abdominal hysterectomy   . Cholecystectomy   . Tonsillectomy   . Cataracts     bilateral    No family history on file.  History  Substance Use Topics  . Smoking status: Not on file  . Smokeless tobacco: Not on file  . Alcohol Use:     OB History    Grav Para Term Preterm Abortions TAB SAB Ect Mult Living                  Review of Systems  Allergies  Review of patient's allergies indicates no known allergies.  Home Medications   Current Outpatient Rx  Name Route Sig Dispense Refill  . ALBUTEROL SULFATE HFA 108 (90 BASE) MCG/ACT IN AERS Inhalation Inhale 2 puffs into the lungs every 4 (four) hours as needed.      . ALPRAZOLAM 0.5 MG PO TABS  1/2-1 tab daily by mouth as needed for acute anxiety. 20 tablet 1  . ASPIRIN 81 MG PO TABS Oral Take 81 mg by mouth daily.      Marland Kitchen BISMUTH SUBSALICYLATE 262 MG PO CHEW Oral  Chew 524 mg by mouth as needed.      . BUDESONIDE 180 MCG/ACT IN AEPB Inhalation Inhale 1 puff into the lungs 2 (two) times daily.      Marland Kitchen OLMESARTAN MEDOXOMIL 20 MG PO TABS Oral Take 20 mg by mouth daily.      Marland Kitchen ONDANSETRON 4 MG PO TBDP Oral Take 4 mg by mouth every 6 (six) hours as needed.      . OXYBUTYNIN 3.9 MG/24HR TD PTTW Transdermal Place 1 patch onto the skin 2 (two) times a week.      Marland Kitchen RALOXIFENE HCL 60 MG PO TABS Oral Take 60 mg by mouth daily.        There were no vitals taken for this visit.  Physical Exam  Nursing note and vitals reviewed. Constitutional: She is oriented to person, place, and time. She appears well-developed and well-nourished.  HENT:  Head: Normocephalic and atraumatic.  Right Ear: Tympanic membrane, external ear and ear canal normal.  Left Ear: Tympanic membrane, external ear and ear canal normal.  Nose: Mucosal edema and rhinorrhea present.  Mouth/Throat: Posterior oropharyngeal erythema present. No oropharyngeal exudate or posterior oropharyngeal edema.  Neck: Neck supple.  Cardiovascular: Regular rhythm and normal heart sounds.   Pulmonary/Chest: Effort normal and breath sounds  normal. No respiratory distress.  Abdominal: Soft. Normal appearance and bowel sounds are normal. She exhibits no mass. There is tenderness in the suprapubic area. There is no rebound, no guarding, no CVA tenderness, no tenderness at McBurney's point and negative Murphy's sign.  Neurological: She is alert and oriented to person, place, and time.  Skin: Skin is warm and dry.  Psychiatric: She has a normal mood and affect. Her speech is normal.    ED Course  Procedures (including critical care time)   Labs Reviewed  URINE CULTURE  POCT URINALYSIS DIPSTICK  POCT RAPID STREP A (OFFICE)   No results found.   1. Acute pharyngitis   2. Dysuria   3. Urinary tract infection   4. Acute upper respiratory infections of unspecified site       MDM  1) Rapid strep negative.   Throat culture negative.  Use nasal saline solution (over the counter) at least 3 times a day. Use over the counter Zyrtec or Coricidin as needed to help with congestion.  If you have hypertension, do not take medicines with sudafed.  Can take tylenol every 6 hours for pain or fever.  Follow up with your primary doctor if no improvement in 5-7 days, sooner if increasing pain, fever, or new symptoms.     2) Take the prescribed antibiotic as directed.  A urinalysis was done in clinic.  A urine culture is pending. Follow up with your PCP or urologist if not improving or if worsening symptoms.       Lily Kocher, MD 05/15/11 1226

## 2011-05-15 NOTE — ED Notes (Signed)
Pt c/o vaginal pain x 1 1/2 mth, IBF helps. Pt also c/o sore throat x today.

## 2011-05-18 LAB — URINE CULTURE

## 2011-05-21 ENCOUNTER — Telehealth: Payer: Self-pay | Admitting: Emergency Medicine

## 2011-06-12 ENCOUNTER — Encounter: Payer: Self-pay | Admitting: Family Medicine

## 2011-06-12 ENCOUNTER — Ambulatory Visit (INDEPENDENT_AMBULATORY_CARE_PROVIDER_SITE_OTHER): Payer: Medicare Other | Admitting: Family Medicine

## 2011-06-12 DIAGNOSIS — J40 Bronchitis, not specified as acute or chronic: Secondary | ICD-10-CM

## 2011-06-12 MED ORDER — AMBULATORY NON FORMULARY MEDICATION: Status: DC

## 2011-06-12 MED ORDER — AMOXICILLIN 875 MG PO TABS: 875.0000 mg | ORAL_TABLET | Freq: Two times a day (BID) | ORAL | Status: AC

## 2011-06-12 NOTE — Progress Notes (Signed)
  Subjective:    Patient ID: Karen Ball, female    DOB: Aug 05, 1917, 77 y.o.   MRN: [id number]  HPI 4 days ago cough and runny nose. Runny nose started about 3 days prior to the cough.  Cough Keeping her awake. Then ST next day. Woke up and ST is better but cough is worse.  Green sputum no fever/chills/SOB. No nasal congestion. Occ HA and runny nose. Trying OTC cough drops, honey. Not needed ot use her inhaler. No change in Ms.   Hx of asthma. Hasn't used an inhaler in years.    Review of Systems     Objective:   Physical Exam  Constitutional: She is oriented to person, place, and time. She appears well-developed and well-nourished.  HENT:  Head: Normocephalic and atraumatic.  Right Ear: External ear normal.  Left Ear: External ear normal.  Nose: Nose normal.  Mouth/Throat: Oropharynx is clear and moist.       TMs and canals are clear.   Eyes: Conjunctivae and EOM are normal. Pupils are equal, round, and reactive to light.  Neck: Neck supple. No thyromegaly present.  Cardiovascular: Normal rate, regular rhythm and normal heart sounds.   Pulmonary/Chest: Effort normal. She has no wheezes.       Coarse BS bilaterally.   Lymphadenopathy:    She has no cervical adenopathy.  Neurological: She is alert and oriented to person, place, and time.  Skin: Skin is warm and dry.  Psychiatric: She has a normal mood and affect. Her behavior is normal.          Assessment & Plan:  Bronchitis - Viral vs bacterial. Bc of her age i will tx her with ABX.  Start amoxicillin. If not getting better or getting worse then call the office. Use inhaler prn. She has had her flu shot.  Symptomatic care. Never smoker.

## 2011-06-12 NOTE — Patient Instructions (Signed)
Call me if you are not getting better in one week or if you are getting worse.

## 2011-06-14 ENCOUNTER — Telehealth: Payer: Self-pay | Admitting: *Deleted

## 2011-06-14 NOTE — Telephone Encounter (Signed)
Still early to tell. Green doesn't always mean infection.  Call if not some  bettter by tomorrow.

## 2011-06-14 NOTE — Telephone Encounter (Signed)
Pt notified of MD instructions. KJ LPN 

## 2011-06-14 NOTE — Telephone Encounter (Signed)
Coughing still with almost clear phlegm, yesterday states phlegm was dark green. Still sore throat, no fever. Still on Amnoxicillan on the 3rd day today. Please advise. Was told to call back iff no better

## 2011-06-15 ENCOUNTER — Telehealth: Payer: Self-pay | Admitting: *Deleted

## 2011-06-15 ENCOUNTER — Ambulatory Visit (INDEPENDENT_AMBULATORY_CARE_PROVIDER_SITE_OTHER): Payer: Medicare Other | Admitting: Family Medicine

## 2011-06-15 VITALS — BP 136/75 | HR 84 | Temp 98.3°F

## 2011-06-15 DIAGNOSIS — J029 Acute pharyngitis, unspecified: Secondary | ICD-10-CM

## 2011-06-15 LAB — CBC WITH DIFFERENTIAL/PLATELET
Eosinophils Relative: 5 % (ref 0–5)
HCT: 39.3 % (ref 36.0–46.0)
Hemoglobin: 13.2 g/dL (ref 12.0–15.0)
MCH: 31.1 pg (ref 26.0–34.0)
MCHC: 33.6 g/dL (ref 30.0–36.0)
MCV: 92.7 fL (ref 78.0–100.0)
Monocytes Relative: 6 % (ref 3–12)
Platelets: 245 10*3/uL (ref 150–400)
RBC: 4.24 MIL/uL (ref 3.87–5.11)
RDW: 13.3 % (ref 11.5–15.5)
WBC: 5.9 10*3/uL (ref 4.0–10.5)

## 2011-06-15 NOTE — Telephone Encounter (Signed)
Have her come in for a strep culture swab. She was neg on the rapid test. We can also order a CBC w/ diff.

## 2011-06-15 NOTE — Progress Notes (Signed)
Patient ID: Karen Ball, female   DOB: [birth year], 77 y.o.   MRN: [id number] Pt swabbed and sent downstairs for lab work.

## 2011-06-15 NOTE — Telephone Encounter (Signed)
Was notified

## 2011-06-15 NOTE — Telephone Encounter (Signed)
Pt calls back this morning and states her sore throat is back and really hurts and is is very weak. Was treated last week

## 2011-06-17 LAB — CULTURE, GROUP A STREP

## 2011-06-28 ENCOUNTER — Telehealth: Payer: Self-pay | Admitting: *Deleted

## 2011-06-28 MED ORDER — DIPHENOXYLATE-ATROPINE 2.5-0.025 MG PO TABS: 1.0000 | ORAL_TABLET | Freq: Four times a day (QID) | ORAL | Status: DC | PRN

## 2011-06-28 NOTE — Telephone Encounter (Signed)
rx sent

## 2011-06-28 NOTE — Telephone Encounter (Signed)
Pt calls and states while in hospital in October she was given generic lomotil for her flare of IBS. Would like to know if you would call this in for her to [facility] pharmacy

## 2011-07-05 ENCOUNTER — Other Ambulatory Visit: Payer: Self-pay | Admitting: *Deleted

## 2011-07-09 ENCOUNTER — Other Ambulatory Visit: Payer: Self-pay | Admitting: Family Medicine

## 2011-07-11 ENCOUNTER — Telehealth: Payer: Self-pay | Admitting: Family Medicine

## 2011-07-11 NOTE — Telephone Encounter (Signed)
error 

## 2011-07-12 ENCOUNTER — Other Ambulatory Visit: Payer: Self-pay | Admitting: *Deleted

## 2011-07-12 MED ORDER — RALOXIFENE HCL 60 MG PO TABS: 60.0000 mg | ORAL_TABLET | Freq: Every day | ORAL | Status: DC

## 2011-07-12 MED ORDER — OLMESARTAN MEDOXOMIL 20 MG PO TABS: 20.0000 mg | ORAL_TABLET | Freq: Every day | ORAL | Status: DC

## 2011-07-19 ENCOUNTER — Ambulatory Visit: Payer: Medicare Other | Admitting: Family Medicine

## 2012-01-16 ENCOUNTER — Other Ambulatory Visit: Payer: Self-pay | Admitting: *Deleted

## 2012-01-16 MED ORDER — RALOXIFENE HCL 60 MG PO TABS: 60.0000 mg | ORAL_TABLET | Freq: Every day | ORAL | Status: DC

## 2012-01-16 MED ORDER — OLMESARTAN MEDOXOMIL 20 MG PO TABS: 20.0000 mg | ORAL_TABLET | Freq: Every day | ORAL | Status: DC

## 2012-03-20 ENCOUNTER — Encounter: Payer: Self-pay | Admitting: Family Medicine

## 2012-03-20 ENCOUNTER — Ambulatory Visit (INDEPENDENT_AMBULATORY_CARE_PROVIDER_SITE_OTHER): Payer: Medicare Other | Admitting: Family Medicine

## 2012-03-20 VITALS — BP 138/69 | HR 89 | Wt 118.0 lb

## 2012-03-20 DIAGNOSIS — Z23 Encounter for immunization: Secondary | ICD-10-CM

## 2012-03-20 DIAGNOSIS — L719 Rosacea, unspecified: Secondary | ICD-10-CM

## 2012-03-20 MED ORDER — METRONIDAZOLE 0.75 % EX GEL: Freq: Two times a day (BID) | CUTANEOUS | Status: DC

## 2012-03-20 NOTE — Addendum Note (Signed)
Addended by: Judie Petit A on: 03/20/2012 02:27 PM   Modules accepted: Orders

## 2012-03-20 NOTE — Progress Notes (Signed)
  Subjective:    Patient ID: Karen Ball, female    DOB: [birth year], 78 y.o.   MRN: [id number]  HPI FAcial rash for over a year. Sometimes will clear up abut then comes back. Says the "pimples" have been leaving red marks. Has been using crotisone cream OTC.  Also tyring biotin and salicylic acid with no improvement.     Review of Systems     Objective:   Physical Exam  Skin:       Erythematous pustules some larger some smaller across the nasal bridge and under both cheeks. Also some smaller pustules on the chin.          Assessment & Plan:  Rosacea.-Discussed treatment options. We'll start metronidazole gel. Followup in 2 months. Discussed she's getting some skin irritation she can decrease down to once a day since it does have an alcohol base. Call if any problems. We did discuss avoiding hot drinks caffeine some exposure etc.  Flu shot given today.

## 2012-03-20 NOTE — Patient Instructions (Signed)
Rosacea Rosacea is similar to acne, with red bumps and pimples appearing around the face. The cause is unknown. It is not an infection. It is often made worse by drinking alcohol, especially red wine, or eating hot or spicy foods. Eating chocolate, nuts, or cheese may also make it worse. It can be severe in some cases and eventually result in a bright, red, swollen nose. Rosacea tends to come and go, and cannot usually be completely cured. Sometimes it remains dormant for many years, and then recurs.  Treatment can be very effective, however, in clearing the rash and preventing the problem. Drug treatment may include topical medicine or an oral antibiotic. Normally 1 to 2 months of treatment is needed to make rosacea go away. Low dose oral antibiotics or topicals may be needed long term for prevention. Call your doctor for a follow up exam in 1 to 2 months, unless your rash worsens and you need more immediate care. Document Released: 07/19/2004 Document Revised: 05/31/2011 Document Reviewed: 05/22/2011 [employer] Patient Information 2012 [employer]. 

## 2012-03-21 ENCOUNTER — Other Ambulatory Visit: Payer: Self-pay | Admitting: *Deleted

## 2012-03-21 MED ORDER — METRONIDAZOLE 0.75 % EX GEL: Freq: Two times a day (BID) | CUTANEOUS | Status: DC

## 2012-04-17 ENCOUNTER — Other Ambulatory Visit: Payer: Self-pay | Admitting: *Deleted

## 2012-04-17 MED ORDER — RALOXIFENE HCL 60 MG PO TABS: 60.0000 mg | ORAL_TABLET | Freq: Every day | ORAL | Status: DC

## 2012-04-17 MED ORDER — OLMESARTAN MEDOXOMIL 20 MG PO TABS: 20.0000 mg | ORAL_TABLET | Freq: Every day | ORAL | Status: DC

## 2012-04-18 ENCOUNTER — Other Ambulatory Visit: Payer: Self-pay | Admitting: *Deleted

## 2012-04-18 ENCOUNTER — Encounter: Payer: Self-pay | Admitting: Family Medicine

## 2012-04-18 ENCOUNTER — Telehealth: Payer: Self-pay | Admitting: *Deleted

## 2012-04-18 ENCOUNTER — Ambulatory Visit (INDEPENDENT_AMBULATORY_CARE_PROVIDER_SITE_OTHER): Payer: Medicare Other | Admitting: Family Medicine

## 2012-04-18 VITALS — BP 177/74 | HR 75 | Temp 97.9°F | Wt 119.0 lb

## 2012-04-18 DIAGNOSIS — N390 Urinary tract infection, site not specified: Secondary | ICD-10-CM

## 2012-04-18 DIAGNOSIS — R110 Nausea: Secondary | ICD-10-CM

## 2012-04-18 DIAGNOSIS — M549 Dorsalgia, unspecified: Secondary | ICD-10-CM

## 2012-04-18 DIAGNOSIS — R300 Dysuria: Secondary | ICD-10-CM

## 2012-04-18 LAB — POCT URINALYSIS DIPSTICK
Ketones, UA: NEGATIVE
Leukocytes, UA: NEGATIVE
Protein, UA: NEGATIVE
Urobilinogen, UA: 0.2
pH, UA: 6.5

## 2012-04-18 MED ORDER — ONDANSETRON 4 MG PO TBDP: 4.0000 mg | ORAL_TABLET | Freq: Once | ORAL | Status: DC

## 2012-04-18 MED ORDER — CIPROFLOXACIN HCL 250 MG PO TABS: ORAL_TABLET | ORAL | Status: AC

## 2012-04-18 NOTE — Telephone Encounter (Signed)
Pt's daughter called and states pt has not been able to pee since she left our office today. Daughter does not know if pt has urinated at all today. Pt's sodium has been low and she has been eating more salt.  Called daughter and pt just peed in a cup so she will drop it off in urgent care and we will run the test

## 2012-04-18 NOTE — Progress Notes (Signed)
CC: Karen Ball is a 78 y.o. female is here for LLQ pain and upper back pain   Subjective: HPI:  Patient describes left pelvic pain that felt like a spasm when she was urinating on Tuesday, the sensation is identical to that of past urinary tract infections. Therefore she took Pyridium that night which resolved her symptoms until Wednesday evening. This is accompanied by urinary frequency and when she would urinate only a small volume would actually pass. For the past 24 hours she's been taking Pyridium and the discomfort is gone but the frequency remains. She denies any color or odor or consistency changes in her urine and has not seen any blood in it. She denies fevers, flank pain, low back pain, GI disturbance, nor confusion. She admits to some chills but states she does have these as she is"cold natured". She's become nauseous over the past 24 hours  She also describes a pressure sensation in her upper back a little higher than shoulder blades does not influenced by exertion, activities, rest, nor certain movements of the torso or neck. It responded to ibuprofen earlier today and was actually absent for a few hours after taking ibuprofen. She's never had this pain before and it seems to be related to the nausea described above. Her appetite has been affected but she did have a full breakfast and lunch this afternoon. She has not vomited but she is burping more than she considers normal. She has a family history of coronary artery disease but none personally that she knows of. She takes an aspirin a day. She denies jaw claudication, diaphoresis, nor limb discomfort. She denies shortness of breath, orthopnea, wheezing, no coughing.   Review Of Systems Outlined In HPI  Past Medical History  Diagnosis Date  . Hearing loss     bilateral hearing aids  . Hypertension      No family history on file.   History  Substance Use Topics  . Smoking status: Never Smoker   . Smokeless tobacco: Not on  file  . Alcohol Use: Not on file     Objective: Filed Vitals:   04/18/12 1318  BP: 177/74  Pulse: 75  Temp: 97.9 F (36.6 C)    General: Alert and Oriented, No Acute Distress HEENT: Pupils equal, round, reactive to light. Conjunctivae clear. Pink inferior turbinates.  Moist mucous membranes, pharynx without inflammation nor lesions.  Neck supple without palpable lymphadenopathy nor abnormal masses. Lungs: Clear to auscultation bilaterally, no wheezing/ronchi/rales.  Comfortable work of breathing. Good air movement. Cardiac: Regular rate and rhythm. Normal S1/S2.  No murmurs, rubs, nor gallops.   Abdomen: Hyperactive bowel sounds, soft and non tender without palpable masses. No rebound tenderness in any quadrant. Back: No CVA tenderness. Pain is localized in the midline of the back at approximately T1/C7 and is not reproducible with firm palpation. Extremities: No peripheral edema.  Strong peripheral pulses.  Mental Status: No depression, anxiety, nor agitation. Skin: Warm and dry.  EKG shows 60 beats per minute sinus rhythm with first degree AV block, prolonged QRS with a right bundle branch block, no ST segment elevation or depression, no pathologic Q waves. Nor T. inversions.  Assessment & Plan: Kabao was seen today for llq pain and upper back pain.  Diagnoses and associated orders for this visit:  Nausea - ondansetron (ZOFRAN-ODT) disintegrating tablet 4 mg; Take 1 tablet (4 mg total) by mouth once.  Back pain - PR ELECTROCARDIOGRAM, COMPLETE  Dysuria - Urine culture  Uti (lower urinary  tract infection) - ciprofloxacin (CIPRO) 250 MG tablet; Take one by mouth twice a day for five days.    An EKG was obtained from an outside facility dated on October of last year showing a similar right bundle branch block, essentially her EKG from today is unchanged relative to the EKG from last October. Very low suspicion for myocardial etiology for her back pain.  After being here  for 30 minutes patient was unable to urinate, she was given a cup to take home and she is optimistic that family member will be to bring a sample back well before because at 5 PM today  Patient was observed vomiting in the parking lot, I approached her and asked her to return to the clinic for further questioning however she said she was not interested in staying. She reported her back pain was improved after vomiting. Her vomit was mostly water with scant digested food from what I could tell. I asked her to strongly consider coming inside but she declined, she agreed to return or seek care if she starts to feel worse.  Patient was able to bring urine sample from home, which stayed after hours to write urinalysis that showed findings suggestive of UTI. A follow the culture will treat with Cipro..  Return if symptoms worsen or fail to improve.

## 2012-04-18 NOTE — Addendum Note (Signed)
Addended by: Wyline Beady on: 04/18/2012 05:33 PM   Modules accepted: Orders

## 2012-04-20 LAB — URINE CULTURE
Colony Count: NO GROWTH
Organism ID, Bacteria: NO GROWTH

## 2012-04-21 ENCOUNTER — Other Ambulatory Visit: Payer: Self-pay | Admitting: *Deleted

## 2012-04-21 ENCOUNTER — Encounter: Payer: Self-pay | Admitting: *Deleted

## 2012-04-21 MED ORDER — RALOXIFENE HCL 60 MG PO TABS: 60.0000 mg | ORAL_TABLET | Freq: Every day | ORAL | Status: DC

## 2012-04-21 MED ORDER — OLMESARTAN MEDOXOMIL 20 MG PO TABS: 20.0000 mg | ORAL_TABLET | Freq: Every day | ORAL | Status: DC

## 2012-04-28 ENCOUNTER — Telehealth: Payer: Self-pay

## 2012-04-28 MED ORDER — RALOXIFENE HCL 60 MG PO TABS: 60.0000 mg | ORAL_TABLET | Freq: Every day | ORAL | Status: DC

## 2012-04-28 MED ORDER — OLMESARTAN MEDOXOMIL 20 MG PO TABS: 20.0000 mg | ORAL_TABLET | Freq: Every day | ORAL | Status: DC

## 2012-04-28 NOTE — Telephone Encounter (Signed)
A 30 day prescription for her Benicar and Evista was sent to [facility], due to the fact [facility] has not sent her medications. They did receive the refill request on 04/21/2012, so, not sure why the hold up.

## 2012-05-16 ENCOUNTER — Encounter: Payer: Self-pay | Admitting: *Deleted

## 2012-05-16 ENCOUNTER — Emergency Department (INDEPENDENT_AMBULATORY_CARE_PROVIDER_SITE_OTHER)
Admission: EM | Admit: 2012-05-16 | Discharge: 2012-05-16 | Disposition: A | Discharge status: Home / Self Care | Payer: Medicare Other | Source: Home / Self Care | Attending: Emergency Medicine | Admitting: Emergency Medicine

## 2012-05-16 DIAGNOSIS — N390 Urinary tract infection, site not specified: Secondary | ICD-10-CM

## 2012-05-16 LAB — POCT URINALYSIS DIP (MANUAL ENTRY)
Bilirubin, UA: NEGATIVE
Blood, UA: NEGATIVE
Glucose, UA: 100
Ketones, POC UA: NEGATIVE
Leukocytes, UA: NEGATIVE
Nitrite, UA: POSITIVE
Protein Ur, POC: NEGATIVE
Spec Grav, UA: 1.01 (ref 1.005–1.03)
Urobilinogen, UA: 0.2 (ref 0–1)
pH, UA: 7 (ref 5–8)

## 2012-05-16 MED ORDER — CIPROFLOXACIN HCL 250 MG PO TABS: 250.0000 mg | ORAL_TABLET | Freq: Two times a day (BID) | ORAL | Status: DC

## 2012-05-16 NOTE — ED Notes (Signed)
Pt c/o dysuria and nausea x today. Denies fever. She has taken AZO and Lomotil today.

## 2012-05-16 NOTE — ED Provider Notes (Signed)
History     CSN: [id number]  Arrival date & time 05/16/12  1543   First MD Initiated Contact with Patient 05/16/12 1558      Chief Complaint  Patient presents with  . Dysuria     Patient is a 78 y.o. female presenting with dysuria. The history is provided by the patient (And daughter).  Dysuria  This is a new problem. The current episode started 6 to 12 hours ago. The problem occurs every urination (And urgency). The problem has been gradually worsening. The quality of the pain is described as burning. The pain is at a severity of 2/10. There has been no fever. She is not sexually active. There is no history of pyelonephritis. Associated symptoms include nausea (Minimal. Tolerating by mouth  well without vomiting), frequency and urgency. Pertinent negatives include no chills, no sweats, no vomiting, no discharge, no hematuria, no hesitancy and no flank pain. She has tried nothing for the symptoms. Her past medical history does not include kidney stones. Past medical history comments: Had UTI one month ago, --urine culture was negative then.--At that time, she was treated with Cipro and the symptoms resolved. She tolerated this Cipro well without any side effects..   earlier today, she tried Azo without significant relief. No history of diabetes or recurrent UTIs. Past Medical History  Diagnosis Date  . Hearing loss     bilateral hearing aids  . Hypertension   Has whitecoat syndrome with systolic BPs in the office in the 160-170 range.  Past Surgical History  Procedure Date  . Abdominal hysterectomy   . Cholecystectomy   . Tonsillectomy   . Cataracts     bilateral    Family History  Problem Relation Age of Onset  . Stroke Mother   . Heart attack Father   . GI problems Father   . Heart attack Brother     History  Substance Use Topics  . Smoking status: Never Smoker   . Smokeless tobacco: Not on file  . Alcohol Use: No    OB History    Grav Para Term Preterm Abortions  TAB SAB Ect Mult Living                  Review of Systems  Constitutional: Positive for fatigue (minimal). Negative for fever, chills and diaphoresis.  HENT: Negative.        Denies dry mouth.  Eyes: Negative.   Respiratory: Negative.  Negative for shortness of breath.   Cardiovascular: Negative for chest pain, palpitations and leg swelling.  Gastrointestinal: Positive for nausea (Minimal. Tolerating by mouth  well without vomiting) and abdominal pain (Minimal suprapubic pain, without any other abdominal pain). Negative for vomiting, diarrhea and blood in stool.  Genitourinary: Positive for dysuria, urgency and frequency. Negative for hesitancy, hematuria, flank pain, vaginal bleeding and vaginal discharge.  Skin: Negative.   Neurological: Negative.   Hematological: Negative.   Psychiatric/Behavioral: Negative.  Negative for hallucinations and confusion.    Allergies  Iodinated diagnostic agents  Home Medications   Current Outpatient Rx  Name  Route  Sig  Dispense  Refill  . ALBUTEROL SULFATE HFA 108 (90 BASE) MCG/ACT IN AERS   Inhalation   Inhale 2 puffs into the lungs every 4 (four) hours as needed.           . ALPRAZOLAM 0.5 MG PO TABS      1/2-1 tab daily by mouth as needed for acute anxiety.   20 tablet  1   . AMBULATORY NON FORMULARY MEDICATION      Medication Name: Zostavax IM x 1   1 vial   0   . ASPIRIN 81 MG PO TABS   Oral   Take 81 mg by mouth daily.           Marland Kitchen BISMUTH SUBSALICYLATE 262 MG PO CHEW   Oral   Chew 524 mg by mouth as needed.           . BUDESONIDE 180 MCG/ACT IN AEPB   Inhalation   Inhale 1 puff into the lungs 2 (two) times daily.           Marland Kitchen CIPROFLOXACIN HCL 250 MG PO TABS   Oral   Take 1 tablet (250 mg total) by mouth 2 (two) times daily.   14 tablet   0   . METRONIDAZOLE 0.75 % EX GEL   Topical   Apply topically 2 (two) times daily.   45 g   2   . OLMESARTAN MEDOXOMIL 20 MG PO TABS   Oral   Take 1 tablet (20  mg total) by mouth daily.   30 tablet   0   . ONDANSETRON 4 MG PO TBDP   Oral   Take 4 mg by mouth every 6 (six) hours as needed.           . OXYTROL 3.9 MG/24HR TD PTTW      APPLY 1 PATCH 1 TO 2 TIMES A WEEK AS DIRECTED   8 each   5   . RALOXIFENE HCL 60 MG PO TABS   Oral   Take 1 tablet (60 mg total) by mouth daily.   30 tablet   0     BP 182/66  Pulse 89  Temp 97.9 F (36.6 C) (Oral)  Resp 16  Ht 5\' 2"  (1.575 m)  Wt 118 lb 4 oz (53.638 kg)  BMI 21.63 kg/m2  SpO2 96%  Physical Exam  Nursing note and vitals reviewed. Constitutional: She is oriented to person, place, and time. She appears well-developed and well-nourished. No distress.  HENT:  Mouth/Throat: Oropharynx is clear and moist.  Eyes: Pupils are equal, round, and reactive to light. No scleral icterus.  Neck: Neck supple.  Cardiovascular: Normal rate, regular rhythm and normal heart sounds.   Pulmonary/Chest: Breath sounds normal.  Abdominal: Soft. Bowel sounds are normal. She exhibits no distension and no mass. There is no hepatosplenomegaly. There is tenderness in the suprapubic area. There is no rebound, no guarding and no CVA tenderness.       No flank or CVA tenderness  Musculoskeletal: Normal range of motion.  Lymphadenopathy:    She has no cervical adenopathy.  Neurological: She is alert and oriented to person, place, and time.  Skin: Skin is warm and dry.  Psychiatric: She has a normal mood and affect. Thought content normal.   BP repeated 162/68 ED Course  Procedures (including critical care time)   Labs Reviewed  POCT URINALYSIS DIP (MANUAL ENTRY)  URINE CULTURE   No results found.   1. UTI (lower urinary tract infection)       MDM  Urinalysis positive for nitrates. Glucose 100 mg per DL.--Urinalysis  otherwise negative. Status with patient and daughter, clinically, she has a very mild, early UTI/cystitis. After risks, benefits, alternatives discussed, Cipro 250 mg twice a day  for 7 days prescribed.--This worked well and she tolerated well in the past. Urine culture sent today. Other advice and  red flags discussed.  UTI information sheet given. Advised to followup with PCP in 7-10 days to recheck urinalysis and recheck BP.--Followup sooner if worse or new symptoms. See detailed Instructions in AVS, which were given to patient. Verbal instructions also given. Risks, benefits, and alternatives of treatment options discussed. Questions invited and answered. Patient and daughter  voiced understanding and agreement with plans.         Lajean Manes, MD 05/16/12 [phone]

## 2012-05-18 ENCOUNTER — Telehealth: Payer: Self-pay

## 2012-05-18 LAB — URINE CULTURE
Colony Count: NO GROWTH
Organism ID, Bacteria: NO GROWTH

## 2012-05-18 NOTE — ED Notes (Signed)
Called patient: advised of urine culture and advised to call back with any concerns or questions if needed.

## 2012-05-26 ENCOUNTER — Encounter: Payer: Self-pay | Admitting: Family Medicine

## 2012-05-26 ENCOUNTER — Other Ambulatory Visit: Payer: Self-pay | Admitting: *Deleted

## 2012-05-26 ENCOUNTER — Ambulatory Visit (INDEPENDENT_AMBULATORY_CARE_PROVIDER_SITE_OTHER): Payer: Medicare Other | Admitting: Family Medicine

## 2012-05-26 VITALS — BP 140/81 | HR 87 | Ht 62.0 in | Wt 115.0 lb

## 2012-05-26 DIAGNOSIS — N390 Urinary tract infection, site not specified: Secondary | ICD-10-CM

## 2012-05-26 DIAGNOSIS — R7301 Impaired fasting glucose: Secondary | ICD-10-CM | POA: Insufficient documentation

## 2012-05-26 LAB — POCT URINALYSIS DIPSTICK
Bilirubin, UA: NEGATIVE
Blood, UA: NEGATIVE
Glucose, UA: NEGATIVE
Ketones, UA: NEGATIVE
Nitrite, UA: NEGATIVE
Spec Grav, UA: 1.015
pH, UA: 7

## 2012-05-26 MED ORDER — METRONIDAZOLE 0.75 % EX GEL: Freq: Two times a day (BID) | CUTANEOUS | Status: DC

## 2012-05-26 MED ORDER — DIPHENOXYLATE-ATROPINE 2.5-0.025 MG PO TABS: 1.0000 | ORAL_TABLET | Freq: Four times a day (QID) | ORAL | Status: DC | PRN

## 2012-05-26 NOTE — Progress Notes (Signed)
Subjective:    Patient ID: Gerrit Friends, female    DOB: September 04, 1917, 78 y.o.   MRN: [id number]  HPI UTI - Seen at [facility]. She is doing well. Completed Abx 3 days ago. Urine culture is neg. + glucose. No polyuria polydipsia. No prior history of diabetes. No fever.   Review of Systems  BP 140/81  Pulse 87  Ht 5\' 2"  (1.575 m)  Wt 115 lb (52.164 kg)  BMI 21.03 kg/m2    Allergies  Allergen Reactions  . Iodinated Diagnostic Agents     Past Medical History  Diagnosis Date  . Hearing loss     bilateral hearing aids  . Hypertension     Past Surgical History  Procedure Date  . Abdominal hysterectomy   . Cholecystectomy   . Tonsillectomy   . Cataracts     bilateral    History   Social History  . Marital Status: Widowed    Spouse Name: N/A    Number of Children: N/A  . Years of Education: N/A   Occupational History  . Not on file.   Social History Main Topics  . Smoking status: Never Smoker   . Smokeless tobacco: Not on file  . Alcohol Use: No  . Drug Use: No  . Sexually Active: Not on file     Comment: lives alone, 2 children, retired.   Other Topics Concern  . Not on file   Social History Narrative  . No narrative on file    Family History  Problem Relation Age of Onset  . Stroke Mother   . Heart attack Father   . GI problems Father   . Heart attack Brother     Outpatient Encounter Prescriptions as of 05/26/2012  Medication Sig Dispense Refill  . albuterol (PROVENTIL HFA;VENTOLIN HFA) 108 (90 BASE) MCG/ACT inhaler Inhale 2 puffs into the lungs every 4 (four) hours as needed.        . ALPRAZolam (XANAX) 0.5 MG tablet 1/2-1 tab daily by mouth as needed for acute anxiety.  20 tablet  1  . AMBULATORY NON FORMULARY MEDICATION Medication Name: Zostavax IM x 1  1 vial  0  . aspirin 81 MG tablet Take 81 mg by mouth daily.        Marland Kitchen bismuth subsalicylate (PEPTO BISMOL) 262 MG chewable tablet Chew 524 mg by mouth as needed.        . budesonide (PULMICORT) 180 MCG/ACT  inhaler Inhale 1 puff into the lungs 2 (two) times daily.        . metroNIDAZOLE (METROGEL) 0.75 % gel Apply topically 2 (two) times daily.  45 g  6  . olmesartan (BENICAR) 20 MG tablet Take 1 tablet (20 mg total) by mouth daily.  30 tablet  0  . ondansetron (ZOFRAN-ODT) 4 MG disintegrating tablet Take 4 mg by mouth every 6 (six) hours as needed.        . OXYTROL 3.9 MG/24HR APPLY 1 PATCH 1 TO 2 TIMES A WEEK AS DIRECTED  8 each  5  . raloxifene (EVISTA) 60 MG tablet Take 1 tablet (60 mg total) by mouth daily.  30 tablet  0  . [DISCONTINUED] metroNIDAZOLE (METROGEL) 0.75 % gel Apply topically 2 (two) times daily.  45 g  2  . ciprofloxacin (CIPRO) 250 MG tablet Take 1 tablet (250 mg total) by mouth 2 (two) times daily.  14 tablet  0  . diphenoxylate-atropine (LOMOTIL) 2.5-0.025 MG per tablet Take 1 tablet by mouth 4 (four)  times daily as needed for diarrhea or loose stools.  20 tablet  2          Objective:   Physical Exam  Constitutional: She is oriented to person, place, and time. She appears well-developed and well-nourished.  HENT:  Head: Normocephalic and atraumatic.  Cardiovascular: Normal rate, regular rhythm and normal heart sounds.   Pulmonary/Chest: Effort normal and breath sounds normal.  Neurological: She is alert and oriented to person, place, and time.  Skin: Skin is warm and dry.  Psychiatric: She has a normal mood and affect. Her behavior is normal.          Assessment & Plan:  UTI - resolved. Repeat UA is negative.    IFG - New dx. we discussed working on limiting concentrated sweets and beverages in addition to watching portion sizes on her carbohydrate intake. We will repeat hemoglobin A1c in 6 months. Lab Results  Component Value Date   HGBA1C 6.1 05/26/2012

## 2012-06-03 ENCOUNTER — Other Ambulatory Visit: Payer: Self-pay | Admitting: Family Medicine

## 2012-08-18 ENCOUNTER — Other Ambulatory Visit: Payer: Self-pay | Admitting: Family Medicine

## 2012-08-18 NOTE — Telephone Encounter (Signed)
OK to fill

## 2012-08-18 NOTE — Telephone Encounter (Signed)
These haven't been filled since November so pt not consistent in taking and wanted to know if this is ok

## 2012-09-25 ENCOUNTER — Encounter: Payer: Self-pay | Admitting: *Deleted

## 2012-09-25 ENCOUNTER — Emergency Department: Admission: EM | Admit: 2012-09-25 | Discharge: 2012-09-25 | Payer: Medicare Other | Source: Home / Self Care

## 2012-09-25 DIAGNOSIS — M545 Low back pain: Secondary | ICD-10-CM

## 2012-09-25 NOTE — ED Notes (Addendum)
Pt c/o upper back pain x today intermittently. She reports having LT upper back pain last night that resolved. She took IBF, 4 baby ASA and applied heat today with no relief. She took 2 Gaviscon today with some relief. Denies injury. She also c/o tingling in her LT arm.

## 2012-09-25 NOTE — ED Notes (Addendum)
Due to pt's elevated Blood pressure, pressure in her upper back intermittently today, and tingling in her LT arm pt was advised to proceed with her daughter to the ED for further evaluation- per Dr. Orson Aloe.

## 2012-09-25 NOTE — ED Provider Notes (Signed)
History     CSN: [id number]  Arrival date & time 09/25/12  1829   None     Chief Complaint  Patient presents with  . Back Pain    (Consider location/radiation/quality/duration/timing/severity/associated sxs/prior treatment) HPI See below   Past Medical History  Diagnosis Date  . Hearing loss     bilateral hearing aids  . Hypertension     Past Surgical History  Procedure Laterality Date  . Abdominal hysterectomy    . Cholecystectomy    . Tonsillectomy    . Cataracts      bilateral    Family History  Problem Relation Age of Onset  . Stroke Mother   . Heart attack Father   . GI problems Father   . Heart attack Brother     History  Substance Use Topics  . Smoking status: Never Smoker   . Smokeless tobacco: Not on file  . Alcohol Use: No    OB History   Grav Para Term Preterm Abortions TAB SAB Ect Mult Living                  Review of Systems  Allergies  Iodinated diagnostic agents  Home Medications   Current Outpatient Rx  Name  Route  Sig  Dispense  Refill  . albuterol (PROVENTIL HFA;VENTOLIN HFA) 108 (90 BASE) MCG/ACT inhaler   Inhalation   Inhale 2 puffs into the lungs every 4 (four) hours as needed.           . ALPRAZolam (XANAX) 0.5 MG tablet      TAKE 1/2 - 1 TABLET BY MOUTH DAILY AS NEEDED FOR ACUTE ANXIETY   20 tablet   0   . AMBULATORY NON FORMULARY MEDICATION      Medication Name: Zostavax IM x 1   1 vial   0   . aspirin 81 MG tablet   Oral   Take 81 mg by mouth daily.           Marland Kitchen BENICAR 20 MG tablet      Take 1 tablet by mouth  daily   90 tablet   1   . bismuth subsalicylate (PEPTO BISMOL) 262 MG chewable tablet   Oral   Chew 524 mg by mouth as needed.           . budesonide (PULMICORT) 180 MCG/ACT inhaler   Inhalation   Inhale 1 puff into the lungs 2 (two) times daily.           . ciprofloxacin (CIPRO) 250 MG tablet   Oral   Take 1 tablet (250 mg total) by mouth 2 (two) times daily.   14 tablet    0   . diphenoxylate-atropine (LOMOTIL) 2.5-0.025 MG per tablet   Oral   Take 1 tablet by mouth 4 (four) times daily as needed for diarrhea or loose stools.   20 tablet   2   . EVISTA 60 MG tablet      Take 1 tablet by mouth  daily   90 tablet   1   . metroNIDAZOLE (METROGEL) 0.75 % gel   Topical   Apply topically 2 (two) times daily.   45 g   6   . olmesartan (BENICAR) 20 MG tablet   Oral   Take 1 tablet (20 mg total) by mouth daily.   30 tablet   0   . ondansetron (ZOFRAN-ODT) 4 MG disintegrating tablet   Oral   Take 4 mg by mouth  every 6 (six) hours as needed.           . OXYTROL 3.9 MG/24HR      APPLY 1 PATCH 1 TO 2 TIMES A WEEK AS DIRECTED   8 each   5   . raloxifene (EVISTA) 60 MG tablet   Oral   Take 1 tablet (60 mg total) by mouth daily.   30 tablet   0     BP 182/71  Pulse 80  Temp(Src) 97.9 F (36.6 C) (Oral)  Resp 18  SpO2 98%  Physical Exam  ED Course  Procedures (including critical care time)  Labs Reviewed - No data to display No results found.   No diagnosis found.    MDM  * Patient not officially seen and no note written and no copay taken.  The patient presents today with back pain. No trauma.  No UTI symptoms.  No URI symptoms, cough, congestion, fever.  No sick contacts.  No recent trauma, lifting, injury.  Pain was left upper back pain x1 day, now in the middle of her back. Described as a pressure / pushing sensation.  Took Gaviscon, also took ASA and heat with no relief.  No CP or SOB but with tingling in both hands L>R.  Has h/o IBS.  Before examining patient, her BP was 190's.  In triage, I discussed with patient and her daughter and agree that she should go to ER.  At her age with vague back pain and tingling in L hand and high BP, could be having cardiac issue vs AAA, etc.  Pt looks healthy for her age and is likely just mild MSK back pain, but I would feel better if she had further eval tonight due to her new onset  symptoms and possible HTN urgency.  Daughter to drive to local ER now.  No treatment given here.  Needs to follow up soon with PCP also to discuss HTN.     Marlaine Hind, MD 09/25/12 [phone]

## 2012-09-26 ENCOUNTER — Ambulatory Visit (INDEPENDENT_AMBULATORY_CARE_PROVIDER_SITE_OTHER): Payer: Medicare Other | Admitting: Family Medicine

## 2012-09-26 ENCOUNTER — Encounter: Payer: Self-pay | Admitting: Family Medicine

## 2012-09-26 VITALS — BP 141/61 | HR 65 | Wt 115.0 lb

## 2012-09-26 DIAGNOSIS — M549 Dorsalgia, unspecified: Secondary | ICD-10-CM

## 2012-09-26 DIAGNOSIS — I10 Essential (primary) hypertension: Secondary | ICD-10-CM

## 2012-09-26 DIAGNOSIS — Z09 Encounter for follow-up examination after completed treatment for conditions other than malignant neoplasm: Secondary | ICD-10-CM

## 2012-09-26 MED ORDER — TRAMADOL HCL 50 MG PO TABS: 50.0000 mg | ORAL_TABLET | Freq: Four times a day (QID) | ORAL | Status: DC | PRN

## 2012-09-26 NOTE — Progress Notes (Signed)
CC: Karen Ball is a 79 y.o. female is here for hospital f/u   Subjective: HPI:  Hospital followup: Patient was seen at [facility] ER yesterday for hypertensive urgency. Prior to this she was having back pain of moderate to severe severity in the midline of the back between the shoulder blades. She was treating this with 800 mg ibuprofen and 4 full aspirins 12 hours prior to presentation. She was sent by urgent care. While in the ED she had a CT scan to rule out T7 compression fracture that was thought to be seen on a  chest x-ray.  Outside records report unremarkable thoracic spine CT scan without contrast. EKG was reportedly unremarkable, troponin negative, CK MB within normal limits, sodium of 131 creatinine 1.2 metabolic panel unremarkable otherwise. Unremarkable CBC. Patient received clonidine 0.1 mg once due to systolic of 200, blood pressure normalized minutes later. Patient was sent home with followup today.  Patient tells me her back pain is now gone. Originally felt like a drill in her back, worse with moving hands above her head, nothing particularly made it better. She denies any recent or remote trauma. Currently she describes no discomfort whatsoever.  Recently she denies fevers, chills, cough, shortness of breath, chest pain, irregular heartbeat, motor or sensory disturbances, skin changes, weakness, nor abdominal pain   Review Of Systems Outlined In HPI  Past Medical History  Diagnosis Date  . Hearing loss     bilateral hearing aids  . Hypertension      Family History  Problem Relation Age of Onset  . Stroke Mother   . Heart attack Father   . GI problems Father   . Heart attack Brother      History  Substance Use Topics  . Smoking status: Never Smoker   . Smokeless tobacco: Not on file  . Alcohol Use: No     Objective: Filed Vitals:   09/26/12 1441  BP: 141/61  Pulse: 65    General: Alert and Oriented, No Acute Distress HEENT: Pupils equal, round,  reactive to light. Conjunctivae clear.  Moist mucous membranes pharynx without inflammation nor lesions.  Neck supple without palpable lymphadenopathy nor abnormal masses. Lungs: Clear to auscultation bilaterally, no wheezing/ronchi/rales.  Comfortable work of breathing. Good air movement. Cardiac: Regular rate and rhythm. Normal S1/S2.  No murmurs, rubs, nor gallops.   Back: Midline tenderness on the spinous process of approximately T5. Full range of motion with respect to bending and rotational and side bending maneuvers. Extremities: No peripheral edema.  Strong peripheral pulses.  Mental Status: No depression, anxiety, nor agitation. Skin: Warm and dry. No skin abnormalities at the site of her pain  Assessment & Plan: [name] was seen today for hospital f/u.  Diagnoses and associated orders for this visit:  Back pain - traMADol (ULTRAM) 50 MG tablet; Take 1 tablet (50 mg total) by mouth every 6 (six) hours as needed for pain.  HYPERTENSION, BENIGN  Hospital discharge follow-up    Discussed with patient and daughter that I was suspicious her elevated blood pressure was secondary to decreased renal perfusion after taking relatively large doses of nonsteroidal anti-inflammatories that day. Pain may have been between 2. Continue current regimen of Benicar. Discussed avoiding nonsteroidal anti-inflammatories and minimizing salt intake to prevent future blood pressure issues.  Should her back pain return May try tramadol. Signs and symptoms requring emergent/urgent reevaluation were discussed with the patient.  25 minutes spent face-to-face during visit today of which at least 50% was counseling  or coordinating care regarding hospital followup, back pain, essential hypertension.   Return if symptoms worsen or fail to improve.

## 2012-11-08 ENCOUNTER — Emergency Department
Admission: EM | Admit: 2012-11-08 | Discharge: 2012-11-08 | Disposition: A | Discharge status: Home / Self Care | Payer: Medicare Other | Source: Home / Self Care | Attending: Family Medicine | Admitting: Family Medicine

## 2012-11-08 DIAGNOSIS — N3000 Acute cystitis without hematuria: Secondary | ICD-10-CM

## 2012-11-08 HISTORY — DX: Irritable bowel syndrome, unspecified: K58.9

## 2012-11-08 LAB — POCT URINALYSIS DIP (MANUAL ENTRY)
Ketones, POC UA: NEGATIVE
Leukocytes, UA: NEGATIVE
Nitrite, UA: POSITIVE
Protein Ur, POC: NEGATIVE
Urobilinogen, UA: 0.2 (ref 0–1)

## 2012-11-08 MED ORDER — CEPHALEXIN 500 MG PO CAPS: 500.0000 mg | ORAL_CAPSULE | Freq: Two times a day (BID) | ORAL | Status: DC

## 2012-11-08 NOTE — ED Provider Notes (Signed)
History     CSN: [id number]  Arrival date & time 11/08/12  1478   First MD Initiated Contact with Patient 11/08/12 1102      Chief Complaint  Patient presents with  . Dysuria      HPI Comments: Patient complains of onset of dysuria at about 11:30pm last night.  No fevers, chills, and sweats.  No nausea/vomiting   Patient is a 79 y.o. female presenting with dysuria. The history is provided by the patient.  Dysuria  This is a recurrent problem. The current episode started 6 to 12 hours ago. The problem occurs every urination. The problem has not changed since onset.The quality of the pain is described as burning. The pain is mild. There has been no fever. Associated symptoms include frequency, hesitancy and urgency. Pertinent negatives include no chills, no sweats, no nausea, no vomiting, no discharge and no hematuria. She has tried nothing for the symptoms. Her past medical history is significant for recurrent UTIs.    Past Medical History  Diagnosis Date  . Hearing loss     bilateral hearing aids  . Hypertension   . IBS (irritable bowel syndrome)     Past Surgical History  Procedure Laterality Date  . Abdominal hysterectomy    . Cholecystectomy    . Tonsillectomy    . Cataracts      bilateral    Family History  Problem Relation Age of Onset  . Stroke Mother   . Heart attack Father   . GI problems Father   . Heart attack Brother     History  Substance Use Topics  . Smoking status: Never Smoker   . Smokeless tobacco: Not on file  . Alcohol Use: No    OB History   Grav Para Term Preterm Abortions TAB SAB Ect Mult Living                  Review of Systems  Constitutional: Negative for chills.  Gastrointestinal: Negative for nausea and vomiting.  Genitourinary: Positive for dysuria, hesitancy, urgency and frequency. Negative for hematuria.  All other systems reviewed and are negative.    Allergies  Iodinated diagnostic agents and Pollen extract  Home  Medications   Current Outpatient Rx  Name  Route  Sig  Dispense  Refill  . albuterol (PROVENTIL HFA;VENTOLIN HFA) 108 (90 BASE) MCG/ACT inhaler   Inhalation   Inhale 2 puffs into the lungs every 4 (four) hours as needed.           . ALPRAZolam (XANAX) 0.5 MG tablet      TAKE 1/2 - 1 TABLET BY MOUTH DAILY AS NEEDED FOR ACUTE ANXIETY   20 tablet   0   . AMBULATORY NON FORMULARY MEDICATION      Medication Name: Zostavax IM x 1   1 vial   0   . aspirin 81 MG tablet   Oral   Take 81 mg by mouth daily.           Marland Kitchen BENICAR 20 MG tablet      Take 1 tablet by mouth  daily   90 tablet   1   . bismuth subsalicylate (PEPTO BISMOL) 262 MG chewable tablet   Oral   Chew 524 mg by mouth as needed.           . budesonide (PULMICORT) 180 MCG/ACT inhaler   Inhalation   Inhale 1 puff into the lungs 2 (two) times daily.           Marland Kitchen  cephALEXin (KEFLEX) 500 MG capsule   Oral   Take 1 capsule (500 mg total) by mouth 2 (two) times daily.   14 capsule   0   . ciprofloxacin (CIPRO) 250 MG tablet   Oral   Take 1 tablet (250 mg total) by mouth 2 (two) times daily.   14 tablet   0   . diphenoxylate-atropine (LOMOTIL) 2.5-0.025 MG per tablet   Oral   Take 1 tablet by mouth 4 (four) times daily as needed for diarrhea or loose stools.   20 tablet   2   . EVISTA 60 MG tablet      Take 1 tablet by mouth  daily   90 tablet   1   . olmesartan (BENICAR) 20 MG tablet   Oral   Take 1 tablet (20 mg total) by mouth daily.   30 tablet   0   . OXYTROL 3.9 MG/24HR      APPLY 1 PATCH 1 TO 2 TIMES A WEEK AS DIRECTED   8 each   5   . raloxifene (EVISTA) 60 MG tablet   Oral   Take 1 tablet (60 mg total) by mouth daily.   30 tablet   0   . traMADol (ULTRAM) 50 MG tablet   Oral   Take 1 tablet (50 mg total) by mouth every 6 (six) hours as needed for pain.   20 tablet   0     BP 175/62  Pulse 71  Temp(Src) 97.8 F (36.6 C) (Oral)  Ht 5\' 2"  (1.575 m)  Wt 117 lb (53.071  kg)  BMI 21.39 kg/m2  SpO2 97%  Physical Exam Nursing notes and Vital Signs reviewed. Appearance:  Patient appears stated age, and in no acute distress Eyes:  Pupils are equal, round, and reactive to light and accomodation.  Extraocular movement is intact.  Conjunctivae are not inflamed   Pharynx:  Normal; moist mucous membranes  Neck:  Supple.   No adenopathy Lungs:  Clear to auscultation.  Breath sounds are equal.  Heart:  Regular rate and rhythm without murmurs, rubs, or gallops.  Abdomen:  Mild tenderness over bladder without masses or hepatosplenomegaly.  Bowel sounds are present.  No CVA or flank tenderness.  Extremities:  No edema.  No calf tenderness Skin:  No rash present.   ED Course  Procedures  none  Labs Reviewed  URINE CULTURE pending  POCT URINALYSIS DIP (MANUAL ENTRY) NIT positive, otherwise negative      1. Acute cystitis       MDM  Chart reviewed.  Previous urine culture revealed e. Coli sensitive to Keflex. Urine culture pending.  Begin Keflex. Continue increased fluid intake.  May continue AZO for two days. Followup with Family Doctor if not improved in about 5 days.        Lattie Haw, MD 11/10/12 [phone]

## 2012-11-08 NOTE — ED Notes (Signed)
Painful urination and pressure started about 11:30 last night

## 2012-11-10 LAB — URINE CULTURE: Colony Count: 85000

## 2012-11-13 ENCOUNTER — Encounter: Payer: Self-pay | Admitting: Family Medicine

## 2012-11-13 ENCOUNTER — Ambulatory Visit (INDEPENDENT_AMBULATORY_CARE_PROVIDER_SITE_OTHER): Payer: Medicare Other | Admitting: Family Medicine

## 2012-11-13 VITALS — BP 122/62 | HR 81 | Wt 114.0 lb

## 2012-11-13 DIAGNOSIS — Z23 Encounter for immunization: Secondary | ICD-10-CM

## 2012-11-13 DIAGNOSIS — N390 Urinary tract infection, site not specified: Secondary | ICD-10-CM

## 2012-11-13 DIAGNOSIS — R7301 Impaired fasting glucose: Secondary | ICD-10-CM

## 2012-11-13 DIAGNOSIS — I10 Essential (primary) hypertension: Secondary | ICD-10-CM

## 2012-11-13 LAB — POCT GLYCOSYLATED HEMOGLOBIN (HGB A1C): Hemoglobin A1C: 6

## 2012-11-13 NOTE — Progress Notes (Signed)
  Subjective:    Patient ID: Karen Ball, female    DOB: 05/02/1918, 79 y.o.   MRN: [id number]  HPI HTN-  Pt denies chest pain, SOB, dizziness, or heart palpitations.  Taking meds as directed w/o problems.  Denies medication side effects.    IFG - No inc thrist or urination.    UTI-she unfortunately workup if symptoms Friday night. She was seen in urgent care and started on Keflex. They didn't send a culture but it was only positive for strep.. She's actually feeling much better on the Keflex and has about 3 more tabs left. No fever. No back pain today.   Review of Systems     Objective:   Physical Exam  Constitutional: She is oriented to person, place, and time. She appears well-developed and well-nourished.  HENT:  Head: Normocephalic and atraumatic.  Neck: Neck supple. No thyromegaly present.  Cardiovascular: Normal rate, regular rhythm and normal heart sounds.   Pulmonary/Chest: Effort normal and breath sounds normal.  Lymphadenopathy:    She has no cervical adenopathy.  Neurological: She is alert and oriented to person, place, and time.  Skin: Skin is warm and dry.  Psychiatric: She has a normal mood and affect. Her behavior is normal.          Assessment & Plan:  HTN -Well controlled. F/U in 6 month.  Warned her to look out for any lightheadedness and dizziness. Certainly we can let her blood pressure on a little bit higher but the last couple times it was well over 150 systolic. Due for CMP.    UTI - she is feeling much better.  If her symptoms recur did not completely resolve then please call us back and we can collect another sample. She has about 3 the Keflex left.  IFG - WEll controlled. Continue to eat healthy and work on regular exercise. Followup in 6 months repeat hemoglobin A1c.  Tdap updated.

## 2012-11-13 NOTE — Addendum Note (Signed)
Addended by: Deno Etienne on: 11/13/2012 11:45 AM   Modules accepted: Orders, SmartSet

## 2012-11-14 ENCOUNTER — Other Ambulatory Visit: Payer: Self-pay | Admitting: Family Medicine

## 2012-11-14 LAB — COMPLETE METABOLIC PANEL WITH GFR
ALT: 14 U/L (ref 0–35)
AST: 20 U/L (ref 0–37)
Creat: 1.21 mg/dL — ABNORMAL HIGH (ref 0.50–1.10)
Total Bilirubin: 0.6 mg/dL (ref 0.3–1.2)

## 2012-11-24 ENCOUNTER — Ambulatory Visit: Payer: Medicare Other | Admitting: Family Medicine

## 2013-01-06 ENCOUNTER — Ambulatory Visit (INDEPENDENT_AMBULATORY_CARE_PROVIDER_SITE_OTHER): Payer: Medicare Other | Admitting: Family Medicine

## 2013-01-06 ENCOUNTER — Encounter: Payer: Self-pay | Admitting: Family Medicine

## 2013-01-06 ENCOUNTER — Ambulatory Visit: Payer: Medicare Other | Admitting: Family Medicine

## 2013-01-06 VITALS — BP 130/57 | HR 85 | Wt 115.0 lb

## 2013-01-06 DIAGNOSIS — R5381 Other malaise: Secondary | ICD-10-CM

## 2013-01-06 DIAGNOSIS — W57XXXA Bitten or stung by nonvenomous insect and other nonvenomous arthropods, initial encounter: Secondary | ICD-10-CM

## 2013-01-06 DIAGNOSIS — R5383 Other fatigue: Secondary | ICD-10-CM

## 2013-01-06 DIAGNOSIS — I10 Essential (primary) hypertension: Secondary | ICD-10-CM

## 2013-01-06 DIAGNOSIS — H6122 Impacted cerumen, left ear: Secondary | ICD-10-CM

## 2013-01-06 DIAGNOSIS — R110 Nausea: Secondary | ICD-10-CM

## 2013-01-06 DIAGNOSIS — R319 Hematuria, unspecified: Secondary | ICD-10-CM

## 2013-01-06 DIAGNOSIS — H6120 Impacted cerumen, unspecified ear: Secondary | ICD-10-CM

## 2013-01-06 LAB — POCT URINALYSIS DIPSTICK
Ketones, UA: NEGATIVE
Nitrite, UA: NEGATIVE
Protein, UA: NEGATIVE
pH, UA: 6.5

## 2013-01-06 MED ORDER — BETAMETHASONE DIPROPIONATE 0.05 % EX CREA: TOPICAL_CREAM | Freq: Two times a day (BID) | CUTANEOUS | Status: DC

## 2013-01-06 NOTE — Progress Notes (Signed)
Subjective:    Patient ID: Karen Ball, female    DOB: Nov 08, 1917, 79 y.o.   MRN: [id number]  HPI She has felt weak and nauseated for 3-4 weeks. Says gets attacks of her IBS.  No blood in the urine or stool. No recent cold or fever. She has been experiencing very brisk reactions to insect bites.Says will swellup as the day goes on.  Go down in about 3-4 days.  Has been putting vinegar and ice on it.  Occ uses skin so soft to avoid the bug.    Fatigue Had BM x 5 yesterday with her IBS and that makes her weak. Then drank gatorade and ensure and that usually helps.  Having more frequent IBS episodes. She's had 2 episodes in the last week. She says the longest usually does without an episode is about 2 weeks.  She would like me to look in her ears today. She's noticed some decreased hearing. Her daughter agrees that it she has not been hearing as well. She does wear bilateral hearing aids. No ear pain or pressure.  A cold about 2 weeks ago. She had some green nasal discharge at that time but says she's feeling much better. No fever.  HTN-  Pt denies chest pain, SOB, dizziness, or heart palpitations.  Taking meds as directed w/o problems.  Denies medication side effects.   Review of Systems No CP or SOB. No lumps or bumps.  No dizziness or lightheadedness. She denies hematuria or dysuria.  BP 130/57  Pulse 85  Wt 115 lb (52.164 kg)  BMI 21.03 kg/m2    Allergies  Allergen Reactions  . Iodinated Diagnostic Agents   . Pollen Extract     Past Medical History  Diagnosis Date  . Hearing loss     bilateral hearing aids  . Hypertension   . IBS (irritable bowel syndrome)     Past Surgical History  Procedure Laterality Date  . Abdominal hysterectomy    . Cholecystectomy    . Tonsillectomy    . Cataracts      bilateral    History   Social History  . Marital Status: Widowed    Spouse Name: N/A    Number of Children: N/A  . Years of Education: N/A   Occupational History  . Not on  file.   Social History Main Topics  . Smoking status: Never Smoker   . Smokeless tobacco: Not on file  . Alcohol Use: No  . Drug Use: No  . Sexually Active: Not on file     Comment: lives alone, 2 children, retired.   Other Topics Concern  . Not on file   Social History Narrative  . No narrative on file    Family History  Problem Relation Age of Onset  . Stroke Mother   . Heart attack Father   . GI problems Father   . Heart attack Brother     Outpatient Encounter Prescriptions as of 01/06/2013  Medication Sig Dispense Refill  . ALPRAZolam (XANAX) 0.5 MG tablet TAKE 1/2 - 1 TABLET BY MOUTH DAILY AS NEEDED FOR ACUTE ANXIETY  20 tablet  0  . aspirin 81 MG tablet Take 81 mg by mouth daily.        . budesonide (PULMICORT) 180 MCG/ACT inhaler Inhale 1 puff into the lungs 2 (two) times daily.        . diphenoxylate-atropine (LOMOTIL) 2.5-0.025 MG per tablet Take 1 tablet by mouth 4 (four) times daily as needed for  diarrhea or loose stools.  20 tablet  2  . olmesartan (BENICAR) 20 MG tablet Take 1 tablet (20 mg total) by mouth daily.  30 tablet  0  . OXYTROL 3.9 MG/24HR APPLY 1 PATCH 1 TO 2 TIMES A WEEK AS DIRECTED  8 each  5  . raloxifene (EVISTA) 60 MG tablet Take 1 tablet (60 mg total) by mouth daily.  30 tablet  0  . albuterol (PROVENTIL HFA;VENTOLIN HFA) 108 (90 BASE) MCG/ACT inhaler Inhale 2 puffs into the lungs every 4 (four) hours as needed.        . betamethasone dipropionate (DIPROLENE) 0.05 % cream Apply topically 2 (two) times daily.  30 g  0  . [DISCONTINUED] cephALEXin (KEFLEX) 500 MG capsule Take 1 capsule (500 mg total) by mouth 2 (two) times daily.  14 capsule  0  . [DISCONTINUED] EVISTA 60 MG tablet Take 1 tablet by mouth  daily  90 tablet  1  . [DISCONTINUED] traMADol (ULTRAM) 50 MG tablet Take 1 tablet (50 mg total) by mouth every 6 (six) hours as needed for pain.  20 tablet  0   No facility-administered encounter medications on file as of 01/06/2013.           Objective:   Physical Exam  Constitutional: She is oriented to person, place, and time. She appears well-developed and well-nourished.  HENT:  Head: Normocephalic and atraumatic.  Right Ear: External ear normal.  Left Ear: External ear normal.  Nose: Nose normal.  Mouth/Throat: Oropharynx is clear and moist.  TMs and canals are clear. Cerumen blocking the left canal.  Eyes: Conjunctivae and EOM are normal. Pupils are equal, round, and reactive to light.  Neck: Neck supple. No thyromegaly present.  Cardiovascular: Normal rate, regular rhythm and normal heart sounds.   Pulmonary/Chest: Effort normal and breath sounds normal. She has no wheezes.  Abdominal: There is tenderness.  Mild suprapubic tenderness.  Musculoskeletal: She exhibits no edema.  Lymphadenopathy:    She has no cervical adenopathy.  Neurological: She is alert and oriented to person, place, and time.  Skin: Skin is warm and dry.  Psychiatric: She has a normal mood and affect.   Continued to irrigate the left ear but she had significant discomfort. Thus manual disimpaction with a curette was performed.       Assessment & Plan:  Insect bites. It sounds like she's getting a large inflammatory reaction to insect bites. She says she's not sure if they're mosquitoes or not. Avoid scratching and irritation and rubbing as this will cause the swelling to increase. It does typically resolve on its own after about 3-4 days which is reassuring that they are not becoming infected. Will treat with a topical steroid cream. Make sure to only avoid use on the face or groin.  Fatigue - will check CBC, TSH, electrolyes.  Hx of low sodium a month ago. Evaluate for UTI.  Nausea and weakness-will check a CBC to make sure that she's not anemic. We'll recheck her sodium to make sure it is not persistently low. Also check urinalysis to make sure she does not have a urinary tract infection. She did have suprapubic tenderness as well as a UA  positive for blood. We'll send for culture. I would like to see her back in 2 weeks just to make sure that she is doing well assuming that everything is normal with her blood work.  IBS - I explained her that there are additional medication such as such as  dicyclomine and Levbid which can be used to help control irritable bowel syndrome, diarrhea predominant. These are both contraindicated in patients her age but certainly they could possibly be used especially is of her frequent bowels are causing weakness and hyponatremia. But I would prefer that she see a gastroenterologist to discuss his treatment options as well as the risks and benefits. She will think about it.  Hypertension-well-controlled. Continue current regimen. Followup in 6 months.  UA + for blood. Will send for culture.   Impacted cerumen, left-we were able to remove a large amount of cerumen from the left ear. Visually the tympanic membrane was normal. Patient tolerated the procedure fairly well. She says she was already able to hear much better after removal of the impaction.

## 2013-01-06 NOTE — Patient Instructions (Signed)
If you're not feeling better over the next 3-4 weeks as far as her irritable bowel is concerned and please let me know and I would like to refer you to a gastroenterologist for further discussion of treatment options to help control your symptoms.

## 2013-01-07 ENCOUNTER — Other Ambulatory Visit: Payer: Self-pay | Admitting: *Deleted

## 2013-01-07 ENCOUNTER — Telehealth: Payer: Self-pay | Admitting: *Deleted

## 2013-01-07 ENCOUNTER — Other Ambulatory Visit: Payer: Self-pay | Admitting: Family Medicine

## 2013-01-07 DIAGNOSIS — W57XXXA Bitten or stung by nonvenomous insect and other nonvenomous arthropods, initial encounter: Secondary | ICD-10-CM

## 2013-01-07 LAB — CBC WITH DIFFERENTIAL/PLATELET
Basophils Absolute: 0 10*3/uL (ref 0.0–0.1)
Basophils Relative: 1 % (ref 0–1)
Eosinophils Absolute: 0.2 10*3/uL (ref 0.0–0.7)
MCHC: 32.9 g/dL (ref 30.0–36.0)
Neutro Abs: 4.2 10*3/uL (ref 1.7–7.7)
Neutrophils Relative %: 54 % (ref 43–77)
Platelets: 322 10*3/uL (ref 150–400)
RDW: 13.3 % (ref 11.5–15.5)

## 2013-01-07 LAB — BASIC METABOLIC PANEL WITH GFR
Calcium: 10 mg/dL (ref 8.4–10.5)
Chloride: 98 mEq/L (ref 96–112)
Creat: 1.37 mg/dL — ABNORMAL HIGH (ref 0.50–1.10)
GFR, Est African American: 39 mL/min — ABNORMAL LOW
GFR, Est Non African American: 34 mL/min — ABNORMAL LOW

## 2013-01-07 LAB — TSH: TSH: 5.117 u[IU]/mL — ABNORMAL HIGH (ref 0.350–4.500)

## 2013-01-07 LAB — VITAMIN B12: Vitamin B-12: 1075 pg/mL — ABNORMAL HIGH (ref 211–911)

## 2013-01-07 MED ORDER — BETAMETHASONE DIPROPIONATE 0.05 % EX CREA: TOPICAL_CREAM | Freq: Two times a day (BID) | CUTANEOUS | Status: DC

## 2013-01-07 MED ORDER — CIPROFLOXACIN HCL 500 MG PO TABS: 500.0000 mg | ORAL_TABLET | Freq: Two times a day (BID) | ORAL | Status: DC

## 2013-01-07 MED ORDER — ALPRAZOLAM 0.5 MG PO TABS: ORAL_TABLET | ORAL | Status: DC

## 2013-01-07 NOTE — Telephone Encounter (Signed)
Patient's daughter called & states that the pt was supposed to have an abx at the pharm for UTI but I didn't see one in her med list.  Pt uses the [facility] [address] in [place].

## 2013-01-07 NOTE — Progress Notes (Signed)
meds resent to correct pharm.

## 2013-01-07 NOTE — Telephone Encounter (Signed)
Patient's daughter notified & rx was resent to [facility] instead of [facility].

## 2013-01-07 NOTE — Telephone Encounter (Signed)
rx sent to pharmacy

## 2013-01-16 ENCOUNTER — Ambulatory Visit (INDEPENDENT_AMBULATORY_CARE_PROVIDER_SITE_OTHER): Payer: Medicare Other | Admitting: Family Medicine

## 2013-01-16 ENCOUNTER — Encounter: Payer: Self-pay | Admitting: Family Medicine

## 2013-01-16 VITALS — BP 137/65 | HR 65 | Temp 97.7°F | Wt 115.0 lb

## 2013-01-16 DIAGNOSIS — R300 Dysuria: Secondary | ICD-10-CM

## 2013-01-16 DIAGNOSIS — N390 Urinary tract infection, site not specified: Secondary | ICD-10-CM

## 2013-01-16 DIAGNOSIS — E871 Hypo-osmolality and hyponatremia: Secondary | ICD-10-CM

## 2013-01-16 DIAGNOSIS — N289 Disorder of kidney and ureter, unspecified: Secondary | ICD-10-CM

## 2013-01-16 LAB — POCT URINALYSIS DIPSTICK
Blood, UA: NEGATIVE
Leukocytes, UA: NEGATIVE
Nitrite, UA: NEGATIVE
Protein, UA: NEGATIVE
pH, UA: 5.5

## 2013-01-16 NOTE — Progress Notes (Signed)
  Subjective:    Patient ID: Karen Ball, female    DOB: 04-24-1918, 79 y.o.   MRN: [id number]  HPI  UTI, Escherichia coli - completed the meication. She is much feeling bettter. Nausea resolved . No abdominal pain or fever.  Hyponatremia-repeat sodium did improve. Still not completely back to normal. She was also noted to have elevated creatinine on labs.  We have called her and asked her to cut her Benicar in half. She's been on a half of a tab daily for one week at this point in time. No chest pain or shortness of breath.  Review of Systems     Objective:   Physical Exam  Constitutional: She is oriented to person, place, and time. She appears well-developed and well-nourished.  HENT:  Head: Normocephalic and atraumatic.  Cardiovascular: Normal rate, regular rhythm and normal heart sounds.   Pulmonary/Chest: Effort normal and breath sounds normal.  Musculoskeletal: She exhibits no edema.  Neurological: She is alert and oriented to person, place, and time.  Skin: Skin is warm and dry.  Psychiatric: She has a normal mood and affect. Her behavior is normal.          Assessment & Plan:  UTI-resolved. Repeat urinalysis is negative today.  Hyponatremia-repeat sodium did improve but I would like to repeat it in about 2 weeks. Lab slip given today.  Abnormal renal function-we did decrease her Benicar to half a tab. She's tolerating it well her blood pressure still looks fantastic today. Will recheck kidney function in about 2 weeks.  Hypertension-still well controlled on decreased Benicar. Also encouraged her to call me when she runs out of the Benicar and we will change her to losartan for cost concerns.

## 2013-01-22 ENCOUNTER — Telehealth: Payer: Self-pay | Admitting: *Deleted

## 2013-01-22 NOTE — Telephone Encounter (Signed)
appt made for tomorrow.Karen Ball

## 2013-01-22 NOTE — Telephone Encounter (Signed)
Pt called and stated that she is having vaginal pain. I asked her if it was like what she was having before and she said that it was. I asked her if she would like to come in to be seen and informed her that Dr.Metheney did not have any appts today however I can get her in tomorrow. She wanted to know if she could come by and submit a urine sample. I told her that I would speak with Dr.Metheney and get back with her.Loralee Pacas [place]

## 2013-01-22 NOTE — Telephone Encounter (Signed)
Pt left a message stating that she needs to come in & see you because she's having vaginal pain.  FYI.  I'll call her & transfer her to scheduling to make an appt.

## 2013-01-22 NOTE — Telephone Encounter (Signed)
Make sure has been using over-the-counter lubricant. If she hasn't she still expensing discomfort and I think it's reasonable for her to come back in and repeat a urinalysis and culture. Since she did have a positive UTI about 2 weeks ago. If the culture is negative and her pain persists then she will need to come in for an office visit for a pelvic exam.

## 2013-01-23 ENCOUNTER — Encounter: Payer: Self-pay | Admitting: Family Medicine

## 2013-01-23 ENCOUNTER — Ambulatory Visit (INDEPENDENT_AMBULATORY_CARE_PROVIDER_SITE_OTHER): Payer: Medicare Other | Admitting: Family Medicine

## 2013-01-23 VITALS — BP 135/63 | HR 81 | Wt 120.0 lb

## 2013-01-23 DIAGNOSIS — R109 Unspecified abdominal pain: Secondary | ICD-10-CM

## 2013-01-23 DIAGNOSIS — R102 Pelvic and perineal pain: Secondary | ICD-10-CM

## 2013-01-23 DIAGNOSIS — E871 Hypo-osmolality and hyponatremia: Secondary | ICD-10-CM

## 2013-01-23 DIAGNOSIS — N949 Unspecified condition associated with female genital organs and menstrual cycle: Secondary | ICD-10-CM

## 2013-01-23 DIAGNOSIS — N390 Urinary tract infection, site not specified: Secondary | ICD-10-CM

## 2013-01-23 DIAGNOSIS — N362 Urethral caruncle: Secondary | ICD-10-CM

## 2013-01-23 DIAGNOSIS — R319 Hematuria, unspecified: Secondary | ICD-10-CM

## 2013-01-23 LAB — POCT URINALYSIS DIPSTICK
Ketones, UA: NEGATIVE
Protein, UA: NEGATIVE
pH, UA: 7

## 2013-01-23 MED ORDER — CEPHALEXIN 500 MG PO CAPS: 500.0000 mg | ORAL_CAPSULE | Freq: Two times a day (BID) | ORAL | Status: DC

## 2013-01-23 NOTE — Progress Notes (Signed)
Subjective:    Patient ID: Karen Ball, female    DOB: 17-Jul-1917, 79 y.o.   MRN: [id number]  HPI Abdominal Pain - Ate a piece of toast and banana for dinner. Then  woke up in the middle of the night, around midnight and felt really nauseated. Got up and ate some graham cracker, gatorade and frozen yogurt,  and then vomited.  Did have a lot of diarrhea with her IBS the day before.  Did take her IBS pill.  Her legs feel really weak and shakey today.   Vaginal Pain - did have recent e coli UTI and was treated. She flet much better for a cuple of days.  Then wed night woke up with vaginal burning. Took an IBU with no relief. Then took a xanax and went to sleep. The following day took AZO for relief.  No dyruia today.  No blood in the urine.  No other worsening or alleviating events. Does feel a little bit better today. No hematuria. No low back pain with it. No fevers chills and  Elevated Cr- she has cut her benicar in half per our recommendations.   Review of Systems     BP 135/63  Pulse 81  Wt 120 lb (54.432 kg)  BMI 21.94 kg/m2    Allergies  Allergen Reactions  . Iodinated Diagnostic Agents   . Pollen Extract     Past Medical History  Diagnosis Date  . Hearing loss     bilateral hearing aids  . Hypertension   . IBS (irritable bowel syndrome)     Past Surgical History  Procedure Laterality Date  . Abdominal hysterectomy    . Cholecystectomy    . Tonsillectomy    . Cataracts      bilateral    History   Social History  . Marital Status: Widowed    Spouse Name: N/A    Number of Children: N/A  . Years of Education: N/A   Occupational History  . Not on file.   Social History Main Topics  . Smoking status: Never Smoker   . Smokeless tobacco: Not on file  . Alcohol Use: No  . Drug Use: No  . Sexually Active: Not on file     Comment: lives alone, 2 children, retired.   Other Topics Concern  . Not on file   Social History Narrative  . No narrative on file     Family History  Problem Relation Age of Onset  . Stroke Mother   . Heart attack Father   . GI problems Father   . Heart attack Brother     Outpatient Encounter Prescriptions as of 01/23/2013  Medication Sig Dispense Refill  . albuterol (PROVENTIL HFA;VENTOLIN HFA) 108 (90 BASE) MCG/ACT inhaler Inhale 2 puffs into the lungs every 4 (four) hours as needed.        . ALPRAZolam (XANAX) 0.5 MG tablet TAKE 1/2 TO 1 TABLET EVERY DAY AS NEEDED FOR ANXIETY  30 tablet  0  . aspirin 81 MG tablet Take 81 mg by mouth daily.        . betamethasone dipropionate (DIPROLENE) 0.05 % cream Apply topically 2 (two) times daily.  30 g  0  . budesonide (PULMICORT) 180 MCG/ACT inhaler Inhale 1 puff into the lungs 2 (two) times daily.        . diphenoxylate-atropine (LOMOTIL) 2.5-0.025 MG per tablet Take 1 tablet by mouth 4 (four) times daily as needed for diarrhea or loose stools.  20 tablet  2  . olmesartan (BENICAR) 20 MG tablet Take 20 mg by mouth daily. Take 1/2 tablet daily      . OXYTROL 3.9 MG/24HR APPLY 1 PATCH 1 TO 2 TIMES A WEEK AS DIRECTED  8 each  5  . raloxifene (EVISTA) 60 MG tablet Take 1 tablet (60 mg total) by mouth daily.  30 tablet  0  . cephALEXin (KEFLEX) 500 MG capsule Take 1 capsule (500 mg total) by mouth 2 (two) times daily.  14 capsule  0   No facility-administered encounter medications on file as of 01/23/2013.       Objective:   Physical Exam  Constitutional: She is oriented to person, place, and time. She appears well-developed and well-nourished.  HENT:  Head: Normocephalic and atraumatic.  Cardiovascular: Normal rate, regular rhythm and normal heart sounds.   Pulmonary/Chest: Effort normal and breath sounds normal.  Abdominal: Soft. Bowel sounds are normal. She exhibits no distension and no mass. There is tenderness. There is no rebound and no guarding.  RUQ and LUQ  Genitourinary: Vagina normal.    There is no rash, tenderness, lesion or injury on the right labia.  There is no rash, tenderness, lesion or injury on the left labia.  Has a polyp extruding from the urethra. No vaginal lesions.   Neurological: She is alert and oriented to person, place, and time.  Skin: Skin is warm and dry.  Psychiatric: She has a normal mood and affect. Her behavior is normal.          Assessment & Plan:  Vaginal Pain - she says the personal lubricant has helped some. Am wondering if the pain she is experiencing is really from your her urethra. Her vaginal exam was fairly normal today except for did see a polyp on the urethra. Continue the lubricant as needed.  Abdominal Pain - most likely related to her IBS. Typically that her course. She'll have multiple bowel movements and then her sodium will drop below and she will vomit.  UTI-will treat with Keflex for 7 days. Call if not significantly better. We'll send for culture. Next  Urethral polyp-refer to urology for further evaluation and treatment. I'm wondering if this might be increasing her risk of recurrent UTIs. I would like him to evaluate with her not they feel it should be removed or not.  Hypertension-well controlled on half the dose of Benicar. We'll repeat her creatinine today as well. It was elevated on the last bloodwork done about 2 weeks ago.

## 2013-01-24 LAB — BASIC METABOLIC PANEL WITH GFR
BUN: 15 mg/dL (ref 6–23)
CO2: 27 mEq/L (ref 19–32)
Calcium: 9.1 mg/dL (ref 8.4–10.5)
Creat: 1.06 mg/dL (ref 0.50–1.10)
GFR, Est Non African American: 46 mL/min — ABNORMAL LOW

## 2013-01-26 LAB — URINE CULTURE

## 2013-02-09 ENCOUNTER — Other Ambulatory Visit: Payer: Self-pay | Admitting: Family Medicine

## 2013-02-12 ENCOUNTER — Encounter: Payer: Self-pay | Admitting: Physician Assistant

## 2013-02-12 ENCOUNTER — Ambulatory Visit (INDEPENDENT_AMBULATORY_CARE_PROVIDER_SITE_OTHER): Payer: Medicare Other | Admitting: Physician Assistant

## 2013-02-12 VITALS — BP 175/51 | HR 75 | Temp 97.6°F | Wt 118.0 lb

## 2013-02-12 DIAGNOSIS — K589 Irritable bowel syndrome without diarrhea: Secondary | ICD-10-CM

## 2013-02-12 DIAGNOSIS — N390 Urinary tract infection, site not specified: Secondary | ICD-10-CM

## 2013-02-12 DIAGNOSIS — K529 Noninfective gastroenteritis and colitis, unspecified: Secondary | ICD-10-CM

## 2013-02-12 DIAGNOSIS — R030 Elevated blood-pressure reading, without diagnosis of hypertension: Secondary | ICD-10-CM

## 2013-02-12 DIAGNOSIS — R197 Diarrhea, unspecified: Secondary | ICD-10-CM

## 2013-02-12 DIAGNOSIS — R300 Dysuria: Secondary | ICD-10-CM

## 2013-02-12 LAB — POCT URINALYSIS DIPSTICK
Nitrite, UA: POSITIVE
Protein, UA: NEGATIVE
Urobilinogen, UA: 0.2

## 2013-02-12 MED ORDER — CIPROFLOXACIN HCL 500 MG PO TABS: 500.0000 mg | ORAL_TABLET | Freq: Two times a day (BID) | ORAL | Status: DC

## 2013-02-12 NOTE — Patient Instructions (Addendum)
Probiotic daily- Align, activa(yogurt)  Lomotil up to 4 times a day. If taking more than 4 days call office.   Start cipro. Follow up with urologist.

## 2013-02-12 NOTE — Progress Notes (Signed)
  Subjective:    Patient ID: Karen Ball, female    DOB: Oct 15, 1917, 79 y.o.   MRN: [id number]  HPI Patient presents to the clinic with 3 days of feeling weak, burning with urination, lower abdominal pressure. Since July she has had a UTI every 2-4 weeks. She has appt with urologist on Monday. Denies any fever or chills. She also does have diarrhea from IBS. She has been having more exacerbations with IBS lately. She doesn't like to take lomotil and has tried pepto bismol but did not work. She feels a little nausea but no vomiting. She is not aware of any IBS triggers. She has kept to a [employer] since diarrhea started.   Elevated BP today. Denies any CP, palpitations, SOB. Did just decrease benicar in half per Dr. Linford Arnold.   Review of Systems     Objective:   Physical Exam  Constitutional: She is oriented to person, place, and time. She appears well-developed and well-nourished.  HENT:  Head: Normocephalic and atraumatic.  Neck: Normal range of motion. Neck supple.  Cardiovascular: Normal rate, regular rhythm and normal heart sounds.   Pulmonary/Chest: Effort normal and breath sounds normal. She has no wheezes.  She did report mild tenderness over CVA region.   Abdominal:  Tenderness over bilateral lower quadrant to palpation.   Lymphadenopathy:    She has no cervical adenopathy.  Neurological: She is alert and oriented to person, place, and time.  Skin:  Skin feels a little clamy.  Psychiatric: She has a normal mood and affect. Her behavior is normal.          Assessment & Plan:  UTI- UA positive for blood, leuks, nitrates. Will send Cipro since just finished keflex. Discussed with pt to keep urology appt on monday for urethral polyp and frequent UTI's. I feel like pt's frequent UTI's might be due to episodes of IBS with loose stools. E.coli from stool is getting into ureteral tract. Continue to stay hydrated.   IBS/diarrhea- Discussed with patient can take Lomotil more  frequently when has a exacerbation for no more than 4 days. If diarrhea presents past 4 days call office. Keep a food diary and watch for IBS triggers. If you can find triggers then maybe you can prevent exacerbations. Continue on BRAT diet. After lomotil if diarrhea does not stop then will test for C.diff since on so many abx lately.   Elevated blood pressure reading- Benicar was just decreased due to low BP. Could be elevated today due to infection. Follow up with PCP in 1-2 weeks to recheck BP.

## 2013-02-16 LAB — URINE CULTURE

## 2013-04-28 ENCOUNTER — Other Ambulatory Visit: Payer: Self-pay | Admitting: *Deleted

## 2013-04-28 MED ORDER — ALPRAZOLAM 0.5 MG PO TABS: ORAL_TABLET | ORAL | Status: DC

## 2013-04-30 ENCOUNTER — Encounter: Payer: Self-pay | Admitting: Family Medicine

## 2013-04-30 ENCOUNTER — Ambulatory Visit (INDEPENDENT_AMBULATORY_CARE_PROVIDER_SITE_OTHER): Payer: Medicare Other | Admitting: Family Medicine

## 2013-04-30 ENCOUNTER — Telehealth: Payer: Self-pay | Admitting: *Deleted

## 2013-04-30 VITALS — BP 165/57 | HR 65 | Temp 98.3°F | Wt 116.0 lb

## 2013-04-30 DIAGNOSIS — N390 Urinary tract infection, site not specified: Secondary | ICD-10-CM

## 2013-04-30 LAB — POCT URINALYSIS DIPSTICK
Ketones, UA: NEGATIVE
Protein, UA: NEGATIVE
Spec Grav, UA: <=1.005
pH, UA: 5.5

## 2013-04-30 MED ORDER — CIPROFLOXACIN HCL 500 MG PO TABS: 500.0000 mg | ORAL_TABLET | Freq: Two times a day (BID) | ORAL | Status: AC

## 2013-04-30 NOTE — Progress Notes (Signed)
  Subjective:    Patient ID: Karen Ball, female    DOB: [birth year], 79 y.o.   MRN: [id number]  HPI  Lower staomch pain started last night that radiates into vagina.  No fever, chills or sweats.  No change in urine color.  Took 2 AZO at 2AM.  Has helped some.    Review of Systems     Objective:   Physical Exam  Constitutional: She is oriented to person, place, and time. She appears well-developed and well-nourished.  HENT:  Head: Normocephalic and atraumatic.  Eyes: Conjunctivae and EOM are normal.  Cardiovascular: Normal rate, regular rhythm and normal heart sounds.   Pulmonary/Chest: Effort normal.  Musculoskeletal:  No CVA tenderness.  Neurological: She is alert and oriented to person, place, and time.  Skin: Skin is dry. No pallor.  Psychiatric: She has a normal mood and affect. Her behavior is normal.          Assessment & Plan:  UTI  - will tx with cipro x5 days. Drink plenty of water and okay to continue Azo-Standard for symptomatic care. If she's not improving after the weekend then please let me know.

## 2013-04-30 NOTE — Patient Instructions (Signed)
Urinary Tract Infection  Urinary tract infections (UTIs) can develop anywhere along your urinary tract. Your urinary tract is your body's drainage system for removing wastes and extra water. Your urinary tract includes two kidneys, two ureters, a bladder, and a urethra. Your kidneys are a pair of bean-shaped organs. Each kidney is about the size of your fist. They are located below your ribs, one on each side of your spine.  CAUSES  Infections are caused by microbes, which are microscopic organisms, including fungi, viruses, and bacteria. These organisms are so small that they can only be seen through a microscope. Bacteria are the microbes that most commonly cause UTIs.  SYMPTOMS   Symptoms of UTIs may vary by age and gender of the patient and by the location of the infection. Symptoms in young women typically include a frequent and intense urge to urinate and a painful, burning feeling in the bladder or urethra during urination. Older women and men are more likely to be tired, shaky, and weak and have muscle aches and abdominal pain. A fever may mean the infection is in your kidneys. Other symptoms of a kidney infection include pain in your back or sides below the ribs, nausea, and vomiting.  DIAGNOSIS  To diagnose a UTI, your caregiver will ask you about your symptoms. Your caregiver also will ask to provide a urine sample. The urine sample will be tested for bacteria and white blood cells. White blood cells are made by your body to help fight infection.  TREATMENT   Typically, UTIs can be treated with medication. Because most UTIs are caused by a bacterial infection, they usually can be treated with the use of antibiotics. The choice of antibiotic and length of treatment depend on your symptoms and the type of bacteria causing your infection.  HOME CARE INSTRUCTIONS   If you were prescribed antibiotics, take them exactly as your caregiver instructs you. Finish the medication even if you feel better after you  have only taken some of the medication.   Drink enough water and fluids to keep your urine clear or pale yellow.   Avoid caffeine, tea, and carbonated beverages. They tend to irritate your bladder.   Empty your bladder often. Avoid holding urine for long periods of time.   Empty your bladder before and after sexual intercourse.   After a bowel movement, women should cleanse from front to back. Use each tissue only once.  SEEK MEDICAL CARE IF:    You have back pain.   You develop a fever.   Your symptoms do not begin to resolve within 3 days.  SEEK IMMEDIATE MEDICAL CARE IF:    You have severe back pain or lower abdominal pain.   You develop chills.   You have nausea or vomiting.   You have continued burning or discomfort with urination.  MAKE SURE YOU:    Understand these instructions.   Will watch your condition.   Will get help right away if you are not doing well or get worse.  Document Released: 03/21/2005 Document Revised: 12/11/2011 Document Reviewed: 07/20/2011  [employer] Patient Information 2014 [employer].

## 2013-04-30 NOTE — Telephone Encounter (Signed)
Pt called and stated that she thinks that she might have a UTI. She has been having sxs x 1 day (last night at 12am)

## 2013-05-18 ENCOUNTER — Other Ambulatory Visit: Payer: Self-pay | Admitting: Family Medicine

## 2013-05-28 ENCOUNTER — Telehealth: Payer: Self-pay | Admitting: *Deleted

## 2013-05-28 MED ORDER — RALOXIFENE HCL 60 MG PO TABS: ORAL_TABLET | ORAL | Status: DC

## 2013-05-28 NOTE — Telephone Encounter (Signed)
Pt called stating that she is in the doughnut hole and is asking that we send her Evista to her local pharmacy. I told her that it may still be expensive and she may want to call to find out the price of this. She voiced understanding. rx sent.Loralee Pacas [place]

## 2013-06-24 ENCOUNTER — Other Ambulatory Visit: Payer: Self-pay | Admitting: Family Medicine

## 2013-07-07 ENCOUNTER — Encounter: Payer: Self-pay | Admitting: Physician Assistant

## 2013-07-07 ENCOUNTER — Ambulatory Visit (INDEPENDENT_AMBULATORY_CARE_PROVIDER_SITE_OTHER): Payer: Medicare HMO | Admitting: Physician Assistant

## 2013-07-07 VITALS — BP 159/70 | HR 101 | Temp 99.6°F | Wt 114.0 lb

## 2013-07-07 DIAGNOSIS — R05 Cough: Secondary | ICD-10-CM

## 2013-07-07 DIAGNOSIS — J209 Acute bronchitis, unspecified: Secondary | ICD-10-CM

## 2013-07-07 DIAGNOSIS — R059 Cough, unspecified: Secondary | ICD-10-CM

## 2013-07-07 DIAGNOSIS — R109 Unspecified abdominal pain: Secondary | ICD-10-CM

## 2013-07-07 LAB — POCT URINALYSIS DIPSTICK
Bilirubin, UA: NEGATIVE
Blood, UA: NEGATIVE
Glucose, UA: NEGATIVE
Ketones, UA: NEGATIVE
NITRITE UA: NEGATIVE
PROTEIN UA: NEGATIVE
UROBILINOGEN UA: 0.2
pH, UA: 6.5

## 2013-07-07 LAB — CBC WITH DIFFERENTIAL/PLATELET
BASOS ABS: 0 10*3/uL (ref 0.0–0.1)
Basophils Relative: 0 % (ref 0–1)
Eosinophils Absolute: 0.2 10*3/uL (ref 0.0–0.7)
Eosinophils Relative: 3 % (ref 0–5)
HCT: 40 % (ref 36.0–46.0)
Hemoglobin: 13.8 g/dL (ref 12.0–15.0)
LYMPHS PCT: 20 % (ref 12–46)
Lymphs Abs: 1.9 10*3/uL (ref 0.7–4.0)
MCH: 32.3 pg (ref 26.0–34.0)
MCHC: 34.5 g/dL (ref 30.0–36.0)
MCV: 93.7 fL (ref 78.0–100.0)
Monocytes Absolute: 0.6 10*3/uL (ref 0.1–1.0)
Monocytes Relative: 6 % (ref 3–12)
Neutro Abs: 6.7 10*3/uL (ref 1.7–7.7)
Neutrophils Relative %: 71 % (ref 43–77)
Platelets: 206 10*3/uL (ref 150–400)
RBC: 4.27 MIL/uL (ref 3.87–5.11)
RDW: 13.5 % (ref 11.5–15.5)
WBC: 9.4 10*3/uL (ref 4.0–10.5)

## 2013-07-07 MED ORDER — DOXYCYCLINE HYCLATE 100 MG PO CAPS: 100.0000 mg | ORAL_CAPSULE | Freq: Two times a day (BID) | ORAL | Status: DC

## 2013-07-07 NOTE — Patient Instructions (Addendum)
Will get cbc. If abdominal pain continues please call office. Will call with culture.   Acute Bronchitis Bronchitis is inflammation of the airways that extend from the windpipe into the lungs (bronchi). The inflammation often causes mucus to develop. This leads to a cough, which is the most common symptom of bronchitis.  In acute bronchitis, the condition usually develops suddenly and goes away over time, usually in a couple weeks. Smoking, allergies, and asthma can make bronchitis worse. Repeated episodes of bronchitis may cause further lung problems.  CAUSES Acute bronchitis is most often caused by the same virus that causes a cold. The virus can spread from person to person (contagious).  SIGNS AND SYMPTOMS   Cough.   Fever.   Coughing up mucus.   Body aches.   Chest congestion.   Chills.   Shortness of breath.   Sore throat.  DIAGNOSIS  Acute bronchitis is usually diagnosed through a physical exam. Tests, such as chest X-rays, are sometimes done to rule out other conditions.  TREATMENT  Acute bronchitis usually goes away in a couple weeks. Often times, no medical treatment is necessary. Medicines are sometimes given for relief of fever or cough. Antibiotics are usually not needed but may be prescribed in certain situations. In some cases, an inhaler may be recommended to help reduce shortness of breath and control the cough. A cool mist vaporizer may also be used to help thin bronchial secretions and make it easier to clear the chest.  HOME CARE INSTRUCTIONS  Get plenty of rest.   Drink enough fluids to keep your urine clear or pale yellow (unless you have a medical condition that requires fluid restriction). Increasing fluids may help thin your secretions and will prevent dehydration.   Only take over-the-counter or prescription medicines as directed by your health care provider.   Avoid smoking and secondhand smoke. Exposure to cigarette smoke or irritating  chemicals will make bronchitis worse. If you are a smoker, consider using nicotine gum or skin patches to help control withdrawal symptoms. Quitting smoking will help your lungs heal faster.   Reduce the chances of another bout of acute bronchitis by washing your hands frequently, avoiding people with cold symptoms, and trying not to touch your hands to your mouth, nose, or eyes.   Follow up with your health care provider as directed.  SEEK MEDICAL CARE IF: Your symptoms do not improve after 1 week of treatment.  SEEK IMMEDIATE MEDICAL CARE IF:  You develop an increased fever or chills.   You have chest pain.   You have severe shortness of breath.  You have bloody sputum.   You develop dehydration.  You develop fainting.  You develop repeated vomiting.  You develop a severe headache. MAKE SURE YOU:   Understand these instructions.  Will watch your condition.  Will get help right away if you are not doing well or get worse. Document Released: 07/19/2004 Document Revised: 02/11/2013 Document Reviewed: 12/02/2012 [facility] Patient Information 2014 [place].

## 2013-07-08 NOTE — Progress Notes (Signed)
   Subjective:    Patient ID: Karen Ball, female    DOB: 1918-01-17, 80 y.o.   MRN: [id number]  HPI Pt is a 80 yo WF who presents to the clinic with her daughter. She has had a cough for a little over a week. In the last 2 days sputum has become green. She feels much more tired. She has felt feverish but not taken temperature. She denies any urinary pain or odor but has been going to the bathroom more over the last few days. Her stools have been loose but denies any blood. She woke up this am with abdominal pain all over. It has been constant and she has felt nauseated. Not tried anything to make better. She denies any ST, ear pin, sinus pressure.    Review of Systems     Objective:   Physical Exam  Constitutional: She appears well-developed and well-nourished.  HENT:  Head: Normocephalic and atraumatic.  Right Ear: External ear normal.  Left Ear: External ear normal.  Nose: Nose normal.  Mouth/Throat: Oropharynx is clear and moist.  Eyes: Conjunctivae are normal.  Clear watery discharge.  Neck: Normal range of motion. Neck supple.  Cardiovascular: Normal rate, regular rhythm and normal heart sounds.   Pulmonary/Chest: Effort normal and breath sounds normal. She has no wheezes.  No CVA tenderness.   Abdominal: Soft. Bowel sounds are normal.  Tenderness with palpation over entire abdomen seemed a little worse over left lower quadrant with some guarding.   Lymphadenopathy:    She has no cervical adenopathy.  Skin:  Pt seemed a little clammy on todays exam.  Psychiatric: She has a normal mood and affect. Her behavior is normal.          Assessment & Plan:  Acute bronchitis/cough- due to age and green sputum treated with doxycycline today. Encouraged cough to keep out of lungs. Call if not able to rest. Albuterol as needed. Continue on pulmicort daily.   Abdominal pain- UA negative for blood or nitrates but positive for leukocytes. Will culture. Will get CBC. If WBC elevated  could consider CT for diverticulitis for abdominal pain. Likely generalized abdominal pain is a cause of viral gastroenteritis. Discussed staying hydrated and keeping to a [employer]. Call if symptoms get worse.

## 2013-07-09 LAB — URINE CULTURE
Colony Count: NO GROWTH
Organism ID, Bacteria: NO GROWTH

## 2013-07-23 ENCOUNTER — Other Ambulatory Visit: Payer: Self-pay | Admitting: Family Medicine

## 2013-09-30 ENCOUNTER — Telehealth: Payer: Self-pay | Admitting: *Deleted

## 2013-09-30 DIAGNOSIS — R21 Rash and other nonspecific skin eruption: Secondary | ICD-10-CM

## 2013-09-30 NOTE — Telephone Encounter (Signed)
Referral placed.Karen Ball

## 2013-10-13 ENCOUNTER — Other Ambulatory Visit: Payer: Self-pay | Admitting: Family Medicine

## 2013-10-16 ENCOUNTER — Ambulatory Visit (INDEPENDENT_AMBULATORY_CARE_PROVIDER_SITE_OTHER): Payer: Medicare HMO | Admitting: Family Medicine

## 2013-10-16 ENCOUNTER — Encounter: Payer: Self-pay | Admitting: Family Medicine

## 2013-10-16 VITALS — BP 121/67 | HR 94 | Temp 97.4°F | Wt 114.0 lb

## 2013-10-16 DIAGNOSIS — J209 Acute bronchitis, unspecified: Secondary | ICD-10-CM

## 2013-10-16 DIAGNOSIS — J329 Chronic sinusitis, unspecified: Secondary | ICD-10-CM

## 2013-10-16 MED ORDER — AZITHROMYCIN 250 MG PO TABS: ORAL_TABLET | ORAL | Status: DC

## 2013-10-16 NOTE — Progress Notes (Signed)
   Subjective:    Patient ID: Karen Ball, female    DOB: 04-01-1918, 80 y.o.   MRN: [id number]  HPI Sinus sxs x 2 days - sxs started on wednesday she has been taking IBU, asa, and claritn. she has a productive cough green mucus. she had episodes of night sweats last night. + productive cough, with green sputum.  Thought it was allergies.  Nose has been runny and having right ear pain.  Had a few abd pains yesterday AM and that resolved with Gas pills.  Eyes feel sore and + HA all days. She feels a little better today.     Review of Systems     Objective:   Physical Exam  Constitutional: She is oriented to person, place, and time. She appears well-developed and well-nourished.  HENT:  Head: Normocephalic and atraumatic.  Right Ear: External ear normal.  Left Ear: External ear normal.  Nose: Nose normal.  Mouth/Throat: Oropharynx is clear and moist.  TMs and canals are clear.   Eyes: Conjunctivae and EOM are normal. Pupils are equal, round, and reactive to light.  Neck: Neck supple. No thyromegaly present.  Cardiovascular: Normal rate, regular rhythm and normal heart sounds.   Pulmonary/Chest: Effort normal and breath sounds normal. She has no wheezes.  Lymphadenopathy:    She has no cervical adenopathy.  Neurological: She is alert and oriented to person, place, and time.  Skin: Skin is warm and dry.  Psychiatric: She has a normal mood and affect.          Assessment & Plan:  Acute sinusitis/bronchitis-encouraged her to use albuterol as needed. She says she hasn't had to use them as to year. I did go ahead and give her prescription of azithromycin to take just in case she is feeling more poorly. Since she does a little bit better today like her to give this at least another 2-3 days to see if she improves on her own. She suddenly feels worse or spikes a fever then please can start the antibiotic. Can continue over-the-counter symptomatic care.

## 2013-11-05 ENCOUNTER — Emergency Department
Admission: EM | Admit: 2013-11-05 | Discharge: 2013-11-05 | Disposition: A | Discharge status: Home / Self Care | Payer: Medicare HMO | Source: Home / Self Care

## 2013-11-05 ENCOUNTER — Encounter: Payer: Self-pay | Admitting: Emergency Medicine

## 2013-11-05 DIAGNOSIS — L259 Unspecified contact dermatitis, unspecified cause: Secondary | ICD-10-CM

## 2013-11-05 DIAGNOSIS — R109 Unspecified abdominal pain: Secondary | ICD-10-CM

## 2013-11-05 LAB — POCT CBC W AUTO DIFF (K'VILLE URGENT CARE)

## 2013-11-05 LAB — POCT URINALYSIS DIP (MANUAL ENTRY)
BILIRUBIN UA: NEGATIVE
GLUCOSE UA: NEGATIVE
Ketones, POC UA: NEGATIVE
Leukocytes, UA: NEGATIVE
NITRITE UA: NEGATIVE
PH UA: 7
Protein Ur, POC: NEGATIVE
RBC UA: NEGATIVE
SPEC GRAV UA: 1.01
Urobilinogen, UA: 0.2

## 2013-11-05 NOTE — ED Provider Notes (Signed)
CSN: [id number]     Arrival date & time 11/05/13  1647 History   None    Chief Complaint  Patient presents with  . Groin Pain   (Consider location/radiation/quality/duration/timing/severity/associated sxs/prior Treatment) HPI 1) this patient was seen about 2 weeks ago and had a small skin cancer removed from the left side of her neck.  She has noticed that it continues to be open and she just wants me to look at it.  No drainage or pus.  No fever chills.  2) she has a history of irritable bowel syndrome.  She has noticed over the last day that she's had some lower abdominal pain.  She went to the bathroom but heart now and the pain resolved.  She just wanted to have it checked out.  No fever or chills but she did have some nausea this morning.  Her IBS normally consists of constipation and this usually flares up on her every day or 2.  No diarrhea.  No upper respiratory symptoms.  No dysuria hematuria frequency.  Pain is located lower down all and is mild/moderate.  Past Medical History  Diagnosis Date  . Hearing loss     bilateral hearing aids  . Hypertension   . IBS (irritable bowel syndrome)    Past Surgical History  Procedure Laterality Date  . Abdominal hysterectomy    . Cholecystectomy    . Tonsillectomy    . Cataracts      bilateral   Family History  Problem Relation Age of Onset  . Stroke Mother   . Heart attack Father   . GI problems Father   . Heart attack Brother    History  Substance Use Topics  . Smoking status: Never Smoker   . Smokeless tobacco: Not on file  . Alcohol Use: No   OB History   Grav Para Term Preterm Abortions TAB SAB Ect Mult Living                 Review of Systems  All other systems reviewed and are negative.   Allergies  Iodinated diagnostic agents and Pollen extract  Home Medications   Prior to Admission medications   Medication Sig Start Date End Date Taking? Authorizing Provider  albuterol (PROVENTIL HFA;VENTOLIN HFA) 108 (90  BASE) MCG/ACT inhaler Inhale 2 puffs into the lungs every 4 (four) hours as needed.      Historical Provider, MD  ALPRAZolam Duanne Moron) 0.5 MG tablet TAKE 1/2 TO 1 TABLET BY MOUTH EVERY DAY AS NEEDED FOR ANXIETY 10/13/13   Hali Marry, MD  aspirin 81 MG tablet Take 81 mg by mouth daily.      Historical Provider, MD  azithromycin (ZITHROMAX) 250 MG tablet 2 tabs on Day 1, then one a day x 4 days. 10/16/13   Hali Marry, MD  BENICAR 20 MG tablet Take 1 tablet by mouth   daily 05/18/13   Hali Marry, MD  betamethasone dipropionate (DIPROLENE) 0.05 % cream Apply topically 2 (two) times daily. 01/07/13   Hali Marry, MD  budesonide (PULMICORT) 180 MCG/ACT inhaler Inhale 1 puff into the lungs 2 (two) times daily.      Historical Provider, MD  diphenoxylate-atropine (LOMOTIL) 2.5-0.025 MG per tablet Take 1 tablet by mouth 4 (four) times daily as needed for diarrhea or loose stools. 05/26/12   Hali Marry, MD  OXYTROL 3.9 MG/24HR APPLY 1 PATCH 1 TO 2 TIMES A WEEK AS DIRECTED 07/09/11   Hali Marry,  MD  raloxifene (EVISTA) 60 MG tablet Take 1 tablet by mouth    daily 05/18/13   Hali Marry, MD  raloxifene (EVISTA) 60 MG tablet TAKE 1 TABLET BY MOUTH DAILY 07/23/13   Hali Marry, MD   BP 179/76  Pulse 81  Temp(Src) 98.1 F (36.7 C) (Oral)  Ht 5\' 2"  (1.575 m)  Wt 114 lb (51.71 kg)  BMI 20.85 kg/m2  SpO2 98% Physical Exam  Nursing note and vitals reviewed. Constitutional: She is oriented to person, place, and time. Vital signs are normal. She appears well-developed and well-nourished.  Non-toxic appearance. She does not have a sickly appearance. No distress.  HENT:  Head: Normocephalic and atraumatic.    Mouth/Throat: Uvula is midline and oropharynx is clear and moist.  Open wound as shown.  No bleeding or pus or surrounding erythema or other signs of infection.  Skin is denuded and there is some mild tenderness to palpation.  Eyes: No  scleral icterus.  Neck: Neck supple.  Cardiovascular: Normal rate, regular rhythm and normal heart sounds.   Pulmonary/Chest: Effort normal and breath sounds normal. No respiratory distress. She has no decreased breath sounds. She has no wheezes. She has no rhonchi.  Abdominal: Soft. Normal appearance. There is no rigidity, no rebound, no guarding, no CVA tenderness, no tenderness at McBurney's point and negative Murphy's sign.    Neurological: She is alert and oriented to person, place, and time.  Skin: Skin is warm and dry.  Psychiatric: She has a normal mood and affect. Her speech is normal and behavior is normal. Thought content normal.    ED Course  Procedures (including critical care time) Labs Review Labs Reviewed  URINE CULTURE  WOUND CULTURE  POCT URINALYSIS DIP (MANUAL ENTRY)  POCT CBC W AUTO DIFF ([facility])    Imaging Review No results found.   MDM   1. Abdominal pain   2. DERMATITIS    1) for the patient's neck wound, I am going to take a culture.  It does not appear to be infected.  No antibiotics are given.  Keep the area clean and dry and perhaps take the bandage off a few hours a day to let it dry out.  Followup with dermatologist if not improving.  2) For her abdominal pain, she states that it has completely resolved.  With a history of IBS, this is likely the culprit.  Differential diagnoses also include a urinary tract infection.  A urinalysis done in-house was normal.  Culture is pending.  Also a CBC was obtained today in clinic which is normal with no elevated white count.  Patient has had a hysterectomy in the past and said she had her appendix out at that time she believes.  Differential diagnosis also includes possible passed kidney stone.  No treatment was rendered.  However ER precautions are given if not improving.  At that time if pain is worsening or developing new symptoms such as a fever or dysuria, should seek medical advice and a CT or  further testing may be appropriate at that time.   Janeann Forehand, MD 11/05/13 1800

## 2013-11-05 NOTE — ED Notes (Signed)
Groin pain since this morning

## 2013-11-07 LAB — URINE CULTURE
Colony Count: NO GROWTH
ORGANISM ID, BACTERIA: NO GROWTH

## 2013-11-08 ENCOUNTER — Telehealth: Payer: Self-pay

## 2013-11-08 LAB — WOUND CULTURE
GRAM STAIN: NONE SEEN
GRAM STAIN: NONE SEEN
GRAM STAIN: NONE SEEN
Organism ID, Bacteria: NO GROWTH

## 2013-11-08 NOTE — ED Notes (Signed)
Left a message on voice mail asking how patient is feeling and advising to call back with any questions or concerns. I left message that the urine and wound culture was normal. Also stated is symptoms persist or worsen to seek medical attention.

## 2013-11-11 ENCOUNTER — Telehealth: Payer: Self-pay | Admitting: *Deleted

## 2013-11-20 ENCOUNTER — Other Ambulatory Visit: Payer: Self-pay | Admitting: Family Medicine

## 2013-11-22 ENCOUNTER — Other Ambulatory Visit: Payer: Self-pay | Admitting: Family Medicine

## 2013-11-24 ENCOUNTER — Encounter: Payer: Self-pay | Admitting: Family Medicine

## 2013-11-24 ENCOUNTER — Ambulatory Visit (INDEPENDENT_AMBULATORY_CARE_PROVIDER_SITE_OTHER): Payer: Medicare HMO | Admitting: Family Medicine

## 2013-11-24 VITALS — BP 168/59 | HR 74 | Temp 97.8°F | Wt 115.0 lb

## 2013-11-24 DIAGNOSIS — N390 Urinary tract infection, site not specified: Secondary | ICD-10-CM

## 2013-11-24 DIAGNOSIS — R300 Dysuria: Secondary | ICD-10-CM

## 2013-11-24 DIAGNOSIS — R81 Glycosuria: Secondary | ICD-10-CM

## 2013-11-24 LAB — POCT URINALYSIS DIPSTICK
Bilirubin, UA: NEGATIVE
Blood, UA: NEGATIVE
Glucose, UA: 100
Ketones, UA: NEGATIVE
Leukocytes, UA: NEGATIVE
Nitrite, UA: POSITIVE
Protein, UA: NEGATIVE
Spec Grav, UA: <=1.005
Urobilinogen, UA: 0.2
pH, UA: 6.5

## 2013-11-24 MED ORDER — SULFAMETHOXAZOLE-TMP DS 800-160 MG PO TABS
1.0000 | ORAL_TABLET | Freq: Two times a day (BID) | ORAL | Status: DC
Start: 2013-11-24 — End: 2013-11-26

## 2013-11-24 MED ORDER — OLMESARTAN MEDOXOMIL 20 MG PO TABS: ORAL_TABLET | ORAL | Status: DC

## 2013-11-24 MED ORDER — RALOXIFENE HCL 60 MG PO TABS: ORAL_TABLET | ORAL | Status: DC

## 2013-11-24 NOTE — Addendum Note (Signed)
Addended by: Teddy Spike on: 11/24/2013 04:04 PM   Modules accepted: Orders

## 2013-11-24 NOTE — Patient Instructions (Addendum)
Follow up in 1-2 weeks to recheck your urine for the glucose. Urinary Tract Infection Urinary tract infections (UTIs) can develop anywhere along your urinary tract. Your urinary tract is your body's drainage system for removing wastes and extra water. Your urinary tract includes two kidneys, two ureters, a bladder, and a urethra. Your kidneys are a pair of bean-shaped organs. Each kidney is about the size of your fist. They are located below your ribs, one on each side of your spine. CAUSES Infections are caused by microbes, which are microscopic organisms, including fungi, viruses, and bacteria. These organisms are so small that they can only be seen through a microscope. Bacteria are the microbes that most commonly cause UTIs. SYMPTOMS  Symptoms of UTIs may vary by age and gender of the patient and by the location of the infection. Symptoms in young women typically include a frequent and intense urge to urinate and a painful, burning feeling in the bladder or urethra during urination. Older women and men are more likely to be tired, shaky, and weak and have muscle aches and abdominal pain. A fever may mean the infection is in your kidneys. Other symptoms of a kidney infection include pain in your back or sides below the ribs, nausea, and vomiting. DIAGNOSIS To diagnose a UTI, your caregiver will ask you about your symptoms. Your caregiver also will ask to provide a urine sample. The urine sample will be tested for bacteria and white blood cells. White blood cells are made by your body to help fight infection. TREATMENT  Typically, UTIs can be treated with medication. Because most UTIs are caused by a bacterial infection, they usually can be treated with the use of antibiotics. The choice of antibiotic and length of treatment depend on your symptoms and the type of bacteria causing your infection. HOME CARE INSTRUCTIONS  If you were prescribed antibiotics, take them exactly as your caregiver instructs  you. Finish the medication even if you feel better after you have only taken some of the medication.  Drink enough water and fluids to keep your urine clear or pale yellow.  Avoid caffeine, tea, and carbonated beverages. They tend to irritate your bladder.  Empty your bladder often. Avoid holding urine for long periods of time.  Empty your bladder before and after sexual intercourse.  After a bowel movement, women should cleanse from front to back. Use each tissue only once. SEEK MEDICAL CARE IF:   You have back pain.  You develop a fever.  Your symptoms do not begin to resolve within 3 days. SEEK IMMEDIATE MEDICAL CARE IF:   You have severe back pain or lower abdominal pain.  You develop chills.  You have nausea or vomiting.  You have continued burning or discomfort with urination. MAKE SURE YOU:   Understand these instructions.  Will watch your condition.  Will get help right away if you are not doing well or get worse. Document Released: 03/21/2005 Document Revised: 12/11/2011 Document Reviewed: 07/20/2011 [facility] Patient Information 2014 [place].

## 2013-11-24 NOTE — Progress Notes (Signed)
   Subjective:    Patient ID: Karen Ball, female    DOB: [birth year], 80 y.o.   MRN: [id number]  HPI Woke up at 2AM with abdominal pain.  Took 2 AZO standard but didn't take away her pain. She felt very shakey and then took a xanax.  Woke up this AM with the pain and now says her IBS has flared as well.  No blood in the urine.  Felt very cold this AM.  No fever.  No back pain.  She had similar symptoms a month a month ago but they were much less intense and resolved by the time she went to urgent care. At that time her urinalysis was completely normal.  Review of Systems     Objective:   Physical Exam  Constitutional: She is oriented to person, place, and time. She appears well-developed and well-nourished.  HENT:  Head: Normocephalic and atraumatic.  Abdominal: Soft. Bowel sounds are normal. She exhibits no distension and no mass. There is no tenderness. There is no rebound and no guarding.  Musculoskeletal:  No CVA tenderness.  Neurological: She is alert and oriented to person, place, and time.  Skin: Skin is warm and dry.  Psychiatric: She has a normal mood and affect. Her behavior is normal.          Assessment & Plan:  UTI - will treat with Bactrim x5 days. If not significantly better by then the week and please give [place] a call. She was on some glucose in the urine test result 12 weeks repeat that to make sure that has cleared. She does have a history of impaired fasting glucose but she is not diabetic.  Glucosuria - will epeat in 1-2 weeks once feeling better.

## 2013-11-25 ENCOUNTER — Telehealth: Payer: Self-pay

## 2013-11-25 NOTE — Telephone Encounter (Signed)
Agree with care plan.  

## 2013-11-25 NOTE — Telephone Encounter (Signed)
Patient's daughter called and reported Karen Ball in vomiting, shaking and has extreme weakness. Due to her age, symptoms and possible UTI I advised her to take Karen Ball to the ED to be evaluated.

## 2013-11-26 ENCOUNTER — Telehealth: Payer: Self-pay | Admitting: *Deleted

## 2013-11-26 DIAGNOSIS — E871 Hypo-osmolality and hyponatremia: Secondary | ICD-10-CM

## 2013-11-26 DIAGNOSIS — R112 Nausea with vomiting, unspecified: Secondary | ICD-10-CM

## 2013-11-26 LAB — BASIC METABOLIC PANEL WITH GFR
BUN: 9 mg/dL (ref 6–23)
CALCIUM: 9.3 mg/dL (ref 8.4–10.5)
CHLORIDE: 92 meq/L — AB (ref 96–112)
CO2: 24 meq/L (ref 19–32)
Creat: 1.19 mg/dL — ABNORMAL HIGH (ref 0.50–1.10)
GFR, Est African American: 45 mL/min — ABNORMAL LOW
GFR, Est Non African American: 39 mL/min — ABNORMAL LOW
Glucose, Bld: 120 mg/dL — ABNORMAL HIGH (ref 70–99)
Potassium: 4.1 mEq/L (ref 3.5–5.3)
SODIUM: 127 meq/L — AB (ref 135–145)

## 2013-11-26 LAB — URINE CULTURE
COLONY COUNT: NO GROWTH
ORGANISM ID, BACTERIA: NO GROWTH

## 2013-11-26 NOTE — Telephone Encounter (Signed)
Daughter states that she took pt to ED and her NA+ was 126 and that they gave her a liter of fluids in ED. States her mom is sick again this am. Would like for me to work her in or go back to ED. Please advise.  Oscar La, LPN

## 2013-11-26 NOTE — Telephone Encounter (Signed)
Daughter informed to stop bactrim and pt was given nausea med from ED. Daughter will bring pt at 1pm for labs. Informed pt we will call when we receive results for further instruction. Oscar La, LPN

## 2013-11-26 NOTE — Telephone Encounter (Signed)
Have her stop the bactrim.  Does she have some nausea medication.  Needs a BMP today.

## 2013-11-27 ENCOUNTER — Other Ambulatory Visit: Payer: Self-pay | Admitting: *Deleted

## 2013-11-27 ENCOUNTER — Ambulatory Visit (INDEPENDENT_AMBULATORY_CARE_PROVIDER_SITE_OTHER): Payer: Medicare HMO | Admitting: Physician Assistant

## 2013-11-27 ENCOUNTER — Ambulatory Visit (INDEPENDENT_AMBULATORY_CARE_PROVIDER_SITE_OTHER): Payer: Medicare HMO

## 2013-11-27 VITALS — BP 137/64 | HR 79 | Wt 116.0 lb

## 2013-11-27 DIAGNOSIS — R059 Cough, unspecified: Secondary | ICD-10-CM

## 2013-11-27 DIAGNOSIS — R05 Cough: Secondary | ICD-10-CM

## 2013-11-27 DIAGNOSIS — I700 Atherosclerosis of aorta: Secondary | ICD-10-CM

## 2013-11-27 DIAGNOSIS — E871 Hypo-osmolality and hyponatremia: Secondary | ICD-10-CM

## 2013-11-27 DIAGNOSIS — R110 Nausea: Secondary | ICD-10-CM

## 2013-11-27 LAB — CBC WITH DIFFERENTIAL/PLATELET
BASOS ABS: 0.1 10*3/uL (ref 0.0–0.1)
BASOS PCT: 1 % (ref 0–1)
EOS ABS: 0.1 10*3/uL (ref 0.0–0.7)
EOS PCT: 1 % (ref 0–5)
HCT: 37.5 % (ref 36.0–46.0)
Hemoglobin: 12.7 g/dL (ref 12.0–15.0)
Lymphocytes Relative: 35 % (ref 12–46)
Lymphs Abs: 2.1 10*3/uL (ref 0.7–4.0)
MCH: 31.4 pg (ref 26.0–34.0)
MCHC: 33.9 g/dL (ref 30.0–36.0)
MCV: 92.9 fL (ref 78.0–100.0)
MONO ABS: 0.4 10*3/uL (ref 0.1–1.0)
Monocytes Relative: 7 % (ref 3–12)
NEUTROS ABS: 3.4 10*3/uL (ref 1.7–7.7)
Neutrophils Relative %: 56 % (ref 43–77)
Platelets: 200 10*3/uL (ref 150–400)
RBC: 4.04 MIL/uL (ref 3.87–5.11)
RDW: 13.4 % (ref 11.5–15.5)
WBC: 6.1 10*3/uL (ref 4.0–10.5)

## 2013-11-27 LAB — BASIC METABOLIC PANEL WITH GFR
BUN: 8 mg/dL (ref 6–23)
CALCIUM: 9.2 mg/dL (ref 8.4–10.5)
CO2: 23 meq/L (ref 19–32)
Chloride: 93 mEq/L — ABNORMAL LOW (ref 96–112)
Creat: 1.24 mg/dL — ABNORMAL HIGH (ref 0.50–1.10)
GFR, EST AFRICAN AMERICAN: 43 mL/min — AB
GFR, Est Non African American: 38 mL/min — ABNORMAL LOW
Glucose, Bld: 92 mg/dL (ref 70–99)
POTASSIUM: 4.8 meq/L (ref 3.5–5.3)
SODIUM: 126 meq/L — AB (ref 135–145)

## 2013-11-27 MED ORDER — ONDANSETRON 4 MG PO TBDP: ORAL_TABLET | ORAL | Status: DC

## 2013-11-30 ENCOUNTER — Encounter: Payer: Self-pay | Admitting: Physician Assistant

## 2013-11-30 DIAGNOSIS — E871 Hypo-osmolality and hyponatremia: Secondary | ICD-10-CM | POA: Insufficient documentation

## 2013-11-30 NOTE — Progress Notes (Signed)
Subjective:    Patient ID: Karen Ball, female    DOB: [birth year], 80 y.o.   MRN: [id number]  HPI Patient is a 80 year old female who presents to the clinic to followup after office visit with Dr. Charise Carwin and ER visit for nausea. She is accompanied by her daughter and son-in-law. She was started by this office on Bactrim for positive nitrates and urine dipstick. Culture was later received and was negative for any bacteria. Patient was instructed to stop Bactrim. Nausea started after starting Bactrim. After further inspection Bactrim can cause some will hyponatremia as well as his biggest side effect is nausea. Patient was sent to ER when she started vomiting. No chest x-ray was done. Patient was tested for UTI and was negative. She was given IV fluids and treated for nausea and sent home. Nausea is much better today she had her last Bactrim yesterday morning. She has not vomited since Tuesday night. She has been drinking some Gatorade. When asked how much she states a half glass at every female. She is taking oral zofran for nausea. Her diarrhea that she originally came in with 4 to [facility] has resolved. She is starting to get her appetite back. Patient does have chronic kidney disease and easily goes into acute renal failure. Patient does have a mild cough. She denies any production.  Last BMP yesterday 127 sodium, serum Cr 1.19   Review of Systems  All other systems reviewed and are negative.      Objective:   Physical Exam  Constitutional: She is oriented to person, place, and time. She appears well-developed and well-nourished.  HENT:  Head: Normocephalic and atraumatic.  Cardiovascular: Normal rate, regular rhythm and normal heart sounds.   Pulmonary/Chest: Effort normal and breath sounds normal.  No CVA tenderness.  Abdominal: Soft. Bowel sounds are normal. She exhibits no distension and no mass. There is no tenderness. There is no rebound and no guarding.  Neurological: She is alert  and oriented to person, place, and time.  Skin: Skin is dry.  Psychiatric: She has a normal mood and affect. Her behavior is normal.          Assessment & Plan:  ED followup/nausea/hyponatremia- I would like to recheck her BMP, CBC and urine. Will make stat. Results for orders placed in visit on 63/84/53  BASIC METABOLIC PANEL WITH GFR      Result Value Ref Range   Sodium 126 (*) 135 - 145 mEq/L   Potassium 4.8  3.5 - 5.3 mEq/L   Chloride 93 (*) 96 - 112 mEq/L   CO2 23  19 - 32 mEq/L   Glucose, Bld 92  70 - 99 mg/dL   BUN 8  6 - 23 mg/dL   Creat 1.24 (*) 0.50 - 1.10 mg/dL   Calcium 9.2  8.4 - 10.5 mg/dL   GFR, Est African American 43 (*)    GFR, Est Non African American 38 (*)   CBC WITH DIFFERENTIAL      Result Value Ref Range   WBC 6.1  4.0 - 10.5 K/uL   RBC 4.04  3.87 - 5.11 MIL/uL   Hemoglobin 12.7  12.0 - 15.0 g/dL   HCT 37.5  36.0 - 46.0 %   MCV 92.9  78.0 - 100.0 fL   MCH 31.4  26.0 - 34.0 pg   MCHC 33.9  30.0 - 36.0 g/dL   RDW 13.4  11.5 - 15.5 %   Platelets 200  150 - 400 K/uL  Neutrophils Relative % 56  43 - 77 %   Neutro Abs 3.4  1.7 - 7.7 K/uL   Lymphocytes Relative 35  12 - 46 %   Lymphs Abs 2.1  0.7 - 4.0 K/uL   Monocytes Relative 7  3 - 12 %   Monocytes Absolute 0.4  0.1 - 1.0 K/uL   Eosinophils Relative 1  0 - 5 %   Eosinophils Absolute 0.1  0.0 - 0.7 K/uL   Basophils Relative 1  0 - 1 %   Basophils Absolute 0.1  0.0 - 0.1 K/uL   Smear Review Criteria for review not met     Patient was called with results after leaving the office and spoke with her daughter. Creatinine has increased slightly but still about patient's baseline previously. Sodium is 126 around patient's baseline. White blood count is normal. Discuss proper hydration. She needs to be drinking more than one half glasses of milk time. She needs at least 32 ounces of fluid today. Followup with Dr. Charise Carwin on Monday or Tuesday to have his labs rechecked do want to make sure patient is stable  before release her. Continue to use zofran as needed but seems she is improving. Did send a refill to the pharmacy of zofran. Likely nausea was cause and some way was caused by Bactrim either by side effect or by worsening hyponatremia which can cause nausea. UA dipstick done in office no blood, leukocytes or nitrates present. Will also order chest x-ray due to age and possibility of another illness causing symptoms.   30 minutes was spent with patient greater than 50% of visit spent counseling family on treatment plan was done previously and will be done today.

## 2013-12-04 ENCOUNTER — Ambulatory Visit (INDEPENDENT_AMBULATORY_CARE_PROVIDER_SITE_OTHER): Payer: Medicare HMO | Admitting: Family Medicine

## 2013-12-04 ENCOUNTER — Encounter: Payer: Self-pay | Admitting: Family Medicine

## 2013-12-04 VITALS — BP 155/65 | HR 78 | Temp 97.2°F | Wt 111.0 lb

## 2013-12-04 DIAGNOSIS — E871 Hypo-osmolality and hyponatremia: Secondary | ICD-10-CM

## 2013-12-04 DIAGNOSIS — R110 Nausea: Secondary | ICD-10-CM

## 2013-12-04 DIAGNOSIS — N179 Acute kidney failure, unspecified: Secondary | ICD-10-CM

## 2013-12-04 NOTE — Progress Notes (Signed)
   Subjective:    Patient ID: Karen Ball, female    DOB: 1917/09/23, 80 y.o.   MRN: [id number]  HPI Hyponatremia-she's here for followup today. She came in with urinary tract symptoms as well as some irritable bowel symptoms. She was started on Bactrim which unfortunately caused some vomiting. That on top of her loose stools with her interval bowel syndrome cause significant hyponatremia. She has a baseline hyponatremia but this aggravated her symptoms and she ended up in the hospital. She followed up last week and labs were repeated but were not quite back to baseline. She's actually feeling much better today and has been eating and drinking normally. She has tried really hard to increase her fluid intake.  CKD - She is feeling better. She has been trying to to drink gatorade.  She is feeling better.  No fever, chills or sweats No swelling.  Appetite is better. Nausea is resolved.     Review of Systems     Objective:   Physical Exam  Constitutional: She is oriented to person, place, and time. She appears well-developed and well-nourished.  HENT:  Head: Normocephalic and atraumatic.  Cardiovascular: Normal rate, regular rhythm and normal heart sounds.   Pulmonary/Chest: Effort normal and breath sounds normal.  Abdominal: Soft. Bowel sounds are normal. She exhibits no distension and no mass. There is no tenderness. There is no rebound and no guarding.  Neurological: She is alert and oriented to person, place, and time.  Skin: Skin is warm and dry.  Psychiatric: She has a normal mood and affect. Her behavior is normal.          Assessment & Plan:  Hyponatremia - will recheck electrolytes today  CKD -will recheck kidney function today.  Nausea-resolved. There her weight is down I do want to keep an eye on this.  Irritable bowel syndrome-she does have some Lomotil to use as needed if the stools become extremely frequent. Continue work on hydration. We discussed how stress can  definitely be a trigger for over bowel syndrome.

## 2013-12-05 LAB — BASIC METABOLIC PANEL WITH GFR
BUN: 17 mg/dL (ref 6–23)
CALCIUM: 9.3 mg/dL (ref 8.4–10.5)
CO2: 26 mEq/L (ref 19–32)
CREATININE: 1.17 mg/dL — AB (ref 0.50–1.10)
Chloride: 96 mEq/L (ref 96–112)
GFR, EST AFRICAN AMERICAN: 46 mL/min — AB
GFR, Est Non African American: 40 mL/min — ABNORMAL LOW
GLUCOSE: 79 mg/dL (ref 70–99)
Potassium: 4.6 mEq/L (ref 3.5–5.3)
Sodium: 130 mEq/L — ABNORMAL LOW (ref 135–145)

## 2013-12-17 ENCOUNTER — Other Ambulatory Visit: Payer: Self-pay | Admitting: Family Medicine

## 2013-12-28 ENCOUNTER — Encounter: Payer: Self-pay | Admitting: Family Medicine

## 2013-12-28 DIAGNOSIS — C4441 Basal cell carcinoma of skin of scalp and neck: Secondary | ICD-10-CM | POA: Insufficient documentation

## 2013-12-30 ENCOUNTER — Ambulatory Visit (INDEPENDENT_AMBULATORY_CARE_PROVIDER_SITE_OTHER): Payer: Medicare HMO | Admitting: Family Medicine

## 2013-12-30 ENCOUNTER — Telehealth: Payer: Self-pay | Admitting: *Deleted

## 2013-12-30 ENCOUNTER — Encounter: Payer: Self-pay | Admitting: Family Medicine

## 2013-12-30 VITALS — BP 200/74 | HR 55 | Temp 98.1°F | Wt 116.0 lb

## 2013-12-30 DIAGNOSIS — I10 Essential (primary) hypertension: Secondary | ICD-10-CM

## 2013-12-30 DIAGNOSIS — L03119 Cellulitis of unspecified part of limb: Secondary | ICD-10-CM

## 2013-12-30 DIAGNOSIS — L03115 Cellulitis of right lower limb: Secondary | ICD-10-CM

## 2013-12-30 DIAGNOSIS — R300 Dysuria: Secondary | ICD-10-CM

## 2013-12-30 DIAGNOSIS — L02419 Cutaneous abscess of limb, unspecified: Secondary | ICD-10-CM

## 2013-12-30 LAB — POCT URINALYSIS DIPSTICK
Bilirubin, UA: NEGATIVE
Glucose, UA: NEGATIVE
Ketones, UA: NEGATIVE
Leukocytes, UA: NEGATIVE
Nitrite, UA: NEGATIVE
PH UA: 6
Protein, UA: NEGATIVE
RBC UA: NEGATIVE
Urobilinogen, UA: 0.2

## 2013-12-30 MED ORDER — CEPHALEXIN 500 MG PO CAPS: 500.0000 mg | ORAL_CAPSULE | Freq: Four times a day (QID) | ORAL | Status: DC

## 2013-12-30 NOTE — Progress Notes (Signed)
CC: Karen Ball is a 80 y.o. female is here for Urinary Tract Infection   Subjective: HPI:  Patient complains of dysuria localized at the distal urethra that has been present for the past one to 2 days. Mild in severity. No interventions as of yet. Was accompanied by a sharp central pelvic pain that was bothering her last night however this has resolved without intervention. Her current pain is nonradiating without any urinary frequency, urgency, hesitancy nor blood in her urine.  She reports increased bowel movements attributed to her IBS however denies blood or melena appearance to her stool. Denies nausea, appetite suppression, nor abdominal pain. Denies flank pain  She points out redness and swelling of her right lateral shin that has been present for the past 24 hours after being bit by unknown bug. She tells me that feels warm, slightly tender, but no pain with weightbearing. No interventions as of yet. She has had no fevers, chills, nausea  I pointed out that her blood pressure was quite elevated today. She believes she took 800 mg of ibuprofen earlier today but no other changes to her medication regimen. She's under the impression that she should only be taking 10 mg of Benicar on a daily basis. Denies chest pain, shortness of breath or any motor or sensory disturbances    Review Of Systems Outlined In HPI  Past Medical History  Diagnosis Date  . Hearing loss     bilateral hearing aids  . Hypertension   . IBS (irritable bowel syndrome)     Past Surgical History  Procedure Laterality Date  . Abdominal hysterectomy    . Cholecystectomy    . Tonsillectomy    . Cataracts      bilateral   Family History  Problem Relation Age of Onset  . Stroke Mother   . Heart attack Father   . GI problems Father   . Heart attack Brother     History   Social History  . Marital Status: Widowed    Spouse Name: N/A    Number of Children: N/A  . Years of Education: N/A   Occupational  History  . Not on file.   Social History Main Topics  . Smoking status: Never Smoker   . Smokeless tobacco: Not on file  . Alcohol Use: No  . Drug Use: No  . Sexual Activity: Not on file     Comment: lives alone, 2 children, retired.   Other Topics Concern  . Not on file   Social History Narrative  . No narrative on file     Objective: BP 200/74  Pulse 55  Temp(Src) 98.1 F (36.7 C) (Oral)  Wt 116 lb (52.617 kg)  General: Alert and Oriented, No Acute Distress HEENT: Pupils equal, round, reactive to light. Conjunctivae clear.   moist membranes pharynx unremarkable  Lungs: Clear to auscultation bilaterally, no wheezing/ronchi/rales.  Comfortable work of breathing. Good air movement. Cardiac: Regular rate and rhythm. Normal S1/S2.  No murmurs, rubs, nor gallops.   Abdomen: Normal bowel sounds, soft and non tender without palpable masses. Extremities:  trace edema on the right distal lateral shin with moderate erythema and mild warmth to touch, 1 mm diameter central break in the skin, the territory of the above findings is no bigger than the width of the hand.  Strong peripheral pulses.  Mental Status: No depression, anxiety, nor agitation. Skin: Warm and dry.  Assessment & Plan: Karen Ball was seen today for urinary tract infection.  Diagnoses and  associated orders for this visit:  Dysuria - Urinalysis Dipstick - cephALEXin (KEFLEX) 500 MG capsule; Take 1 capsule (500 mg total) by mouth 4 (four) times daily. - Urine culture  HYPERTENSION, BENIGN  Cellulitis of leg, right    Dysuria: Urinalysis is unremarkable therefore ultimate antibiotic decision will be based on urine culture given her mild symptoms. Cellulitis: Start Keflex as her presentation most likely represents a strep infection, if she actually does have a UTI hopefully Keflex will help with that as well Essential hypertension: Uncontrolled chronic condition of encouraged her to take 20 mg of Benicar daily as  listed in her medication list. Start by taking 10 mg of Benicar when she gets home then 20 mg daily. Followup on Friday for nurse visit blood pressure check   Return in about 2 days (around 01/01/2014) for Nurse visit blood pressure check.

## 2013-12-30 NOTE — Telephone Encounter (Signed)
Pt called and stated that she is having some vaginal pain. appt made for today with Dr. Ileene Rubens.Audelia Hives [place]

## 2014-01-01 ENCOUNTER — Other Ambulatory Visit: Payer: Self-pay | Admitting: Family Medicine

## 2014-01-01 ENCOUNTER — Ambulatory Visit (INDEPENDENT_AMBULATORY_CARE_PROVIDER_SITE_OTHER): Payer: Medicare HMO | Admitting: Family Medicine

## 2014-01-01 VITALS — BP 169/63 | HR 83 | Temp 98.1°F | Wt 111.0 lb

## 2014-01-01 DIAGNOSIS — I10 Essential (primary) hypertension: Secondary | ICD-10-CM

## 2014-01-01 LAB — URINE CULTURE: Colony Count: 35000

## 2014-01-01 MED ORDER — OLMESARTAN MEDOXOMIL 40 MG PO TABS: ORAL_TABLET | ORAL | Status: DC

## 2014-01-01 NOTE — Progress Notes (Signed)
   Subjective:    Patient ID: Karen Ball, female    DOB: 05-08-1918, 80 y.o.   MRN: [id number]  HPI She's here for followup elevated blood pressure. She came in acute visit for cellulitis and a urinary tract infection. Her systolic blood pressure was 200 at the time. She had not been taking her Benicar consistently. She is now taking Benicar 20 mg daily.   Review of Systems     Objective:   Physical Exam        Assessment & Plan:  Hypertension-uncontrolled. Blood pressure is not well-controlled today but looks much better than 2 days ago. I will go ahead and increase her Benicar to 40 mg. Pressure followup in 7-10 days with me and we'll recheck a BMP at that point as to make sure that BUN and creatinine are stable on the increased dose of Benicar. If she starts to feel lightheaded or dizzy like her pressures go into low and she can call the office.  Hypertension- Pt denies chest pain, SOB, dizziness, or heart palpitations.  Taking meds as directed w/o problems.  Denies medication side effects.

## 2014-01-01 NOTE — Progress Notes (Signed)
Pt came in for a bp check only today./Shakeila Pfarr,CMA

## 2014-01-06 ENCOUNTER — Ambulatory Visit (INDEPENDENT_AMBULATORY_CARE_PROVIDER_SITE_OTHER): Payer: Medicare HMO | Admitting: Family Medicine

## 2014-01-06 ENCOUNTER — Encounter: Payer: Self-pay | Admitting: Family Medicine

## 2014-01-06 VITALS — BP 161/71 | HR 79 | Wt 111.0 lb

## 2014-01-06 DIAGNOSIS — L02419 Cutaneous abscess of limb, unspecified: Secondary | ICD-10-CM

## 2014-01-06 DIAGNOSIS — L03119 Cellulitis of unspecified part of limb: Secondary | ICD-10-CM

## 2014-01-06 DIAGNOSIS — I10 Essential (primary) hypertension: Secondary | ICD-10-CM

## 2014-01-06 DIAGNOSIS — L03115 Cellulitis of right lower limb: Secondary | ICD-10-CM

## 2014-01-06 NOTE — Progress Notes (Signed)
   Subjective:    Patient ID: Karen Ball, female    DOB: 12-22-1917, 80 y.o.   MRN: [id number]  HPI Followup cellulitis on the right lower leg-she still taking her Keflex and has a few days left. She says it feels much better. Still little bit sensitive to touch but the warmth is better and she's no longer feeling poorly.  Hypertension-she came in for a nurse visit and we increased her medication to 40 mg. She's tolerating it well without any side effects including dizziness or nausea or palpitations.   Review of Systems     Objective:   Physical Exam  Constitutional: She is oriented to person, place, and time. She appears well-developed and well-nourished.  HENT:  Head: Normocephalic and atraumatic.  Cardiovascular: Normal rate, regular rhythm and normal heart sounds.   Pulmonary/Chest: Effort normal and breath sounds normal.  Musculoskeletal:  Right lower leg which is a little bit of mild erythema. No induration or swelling. No open lesions or wounds.  Neurological: She is alert and oriented to person, place, and time.  Skin: Skin is warm and dry.  Psychiatric: She has a normal mood and affect. Her behavior is normal.          Assessment & Plan:  Cellulitis-resolving. Her leg actually looks great today. Make sure complete the full course of antibiotics. She still has a couple days left.  Hypertension-her blood pressure does look a little bit better today.  We'll continue current regimen check a BMP today. Felt in 6 weeks for repeat blood pressure check. It does look well a little bit better than it did 5 days ago.

## 2014-01-07 LAB — BASIC METABOLIC PANEL WITH GFR
BUN: 20 mg/dL (ref 6–23)
CHLORIDE: 99 meq/L (ref 96–112)
CO2: 25 mEq/L (ref 19–32)
Calcium: 9.1 mg/dL (ref 8.4–10.5)
Creat: 1.14 mg/dL — ABNORMAL HIGH (ref 0.50–1.10)
GFR, EST NON AFRICAN AMERICAN: 42 mL/min — AB
GFR, Est African American: 48 mL/min — ABNORMAL LOW
Glucose, Bld: 90 mg/dL (ref 70–99)
POTASSIUM: 4.1 meq/L (ref 3.5–5.3)
SODIUM: 132 meq/L — AB (ref 135–145)

## 2014-01-18 ENCOUNTER — Other Ambulatory Visit: Payer: Self-pay | Admitting: Family Medicine

## 2014-02-15 ENCOUNTER — Ambulatory Visit: Payer: Medicare HMO | Admitting: Family Medicine

## 2014-02-22 ENCOUNTER — Encounter: Payer: Self-pay | Admitting: Family Medicine

## 2014-02-22 ENCOUNTER — Ambulatory Visit (INDEPENDENT_AMBULATORY_CARE_PROVIDER_SITE_OTHER): Payer: Medicare HMO | Admitting: Family Medicine

## 2014-02-22 VITALS — BP 142/78 | HR 72 | Wt 111.0 lb

## 2014-02-22 DIAGNOSIS — R7301 Impaired fasting glucose: Secondary | ICD-10-CM

## 2014-02-22 DIAGNOSIS — Z23 Encounter for immunization: Secondary | ICD-10-CM

## 2014-02-22 DIAGNOSIS — I10 Essential (primary) hypertension: Secondary | ICD-10-CM

## 2014-02-22 LAB — POCT GLYCOSYLATED HEMOGLOBIN (HGB A1C): Hemoglobin A1C: 5.9

## 2014-02-22 NOTE — Progress Notes (Signed)
   Subjective:    Patient ID: Alfonse Ras, female    DOB: [birth year], 80 y.o.   MRN: [id number]  Hypertension   Here for followup blood pressure. Her blood pressure was quite elevated last saw her last time as she was being treated for cellulitis. It is primarily just recheck her pressure to make sure was coming back down to normal range.  IFG - no inc thirst or urination.   Cellulitis  - resolved. Still some skin color change. Completed antibiotics.   Review of Systems     Objective:   Physical Exam  Constitutional: She is oriented to person, place, and time. She appears well-developed and well-nourished.  HENT:  Head: Normocephalic and atraumatic.  Cardiovascular: Normal rate, regular rhythm and normal heart sounds.   Pulmonary/Chest: Effort normal and breath sounds normal.  Neurological: She is alert and oriented to person, place, and time.  Skin: Skin is warm and dry.  Psychiatric: She has a normal mood and affect. Her behavior is normal.          Assessment & Plan:  Hypertension-just borderline but almost at goal. Much improved from previous. F/U in 6 months.    IFG - Stable from 1 year ago. Recheck in 6-12 months.   Cellulitis- resolved.    Flu vaccine given today.

## 2014-02-24 ENCOUNTER — Encounter: Payer: Self-pay | Admitting: Family Medicine

## 2014-02-24 ENCOUNTER — Ambulatory Visit (INDEPENDENT_AMBULATORY_CARE_PROVIDER_SITE_OTHER): Payer: Commercial Managed Care - HMO | Admitting: Family Medicine

## 2014-02-24 VITALS — BP 173/59 | HR 71 | Temp 97.6°F | Wt 116.0 lb

## 2014-02-24 DIAGNOSIS — R300 Dysuria: Secondary | ICD-10-CM

## 2014-02-24 LAB — POCT URINALYSIS DIPSTICK
Bilirubin, UA: NEGATIVE
Blood, UA: NEGATIVE
GLUCOSE UA: NEGATIVE
Ketones, UA: NEGATIVE
LEUKOCYTES UA: NEGATIVE
Nitrite, UA: POSITIVE
PROTEIN UA: NEGATIVE
UROBILINOGEN UA: 0.2
pH, UA: 6

## 2014-02-24 MED ORDER — CIPROFLOXACIN HCL 250 MG PO TABS: ORAL_TABLET | ORAL | Status: AC

## 2014-02-24 NOTE — Progress Notes (Signed)
CC: Vaneza Pickart is a 80 y.o. female is here for Dysuria   Subjective: HPI:  Awoke this morning at 1 AM with spasm in her lower pelvis. She localizes it to low in the abdomen just above the vagina. Is accompanied by mild dysuria. Interventions have included Pyridium which helps for 2-3 hours. Pain is described as moderate in severity at its worst mild at best. Pain is nonradiating. She was in her regular state of health until early this morning. Accompanied by urinary frequency. Denies fevers, chills, nausea, flank pain, blood in urine, nor any other genitourinary complaints.    Review Of Systems Outlined In HPI  Past Medical History  Diagnosis Date  . Hearing loss     bilateral hearing aids  . Hypertension   . IBS (irritable bowel syndrome)     Past Surgical History  Procedure Laterality Date  . Abdominal hysterectomy    . Cholecystectomy    . Tonsillectomy    . Cataracts      bilateral   Family History  Problem Relation Age of Onset  . Stroke Mother   . Heart attack Father   . GI problems Father   . Heart attack Brother     History   Social History  . Marital Status: Widowed    Spouse Name: N/A    Number of Children: N/A  . Years of Education: N/A   Occupational History  . Not on file.   Social History Main Topics  . Smoking status: Never Smoker   . Smokeless tobacco: Not on file  . Alcohol Use: No  . Drug Use: No  . Sexual Activity: Not on file     Comment: lives alone, 2 children, retired.   Other Topics Concern  . Not on file   Social History Narrative  . No narrative on file     Objective: BP 173/59  Pulse 71  Temp(Src) 97.6 F (36.4 C) (Oral)  Wt 116 lb (52.617 kg)  Vital signs reviewed. General: Alert and Oriented, No Acute Distress HEENT: Pupils equal, round, reactive to light. Conjunctivae clear.  External ears unremarkable.  Moist mucous membranes. Lungs: Clear and comfortable work of breathing, speaking in full sentences without  accessory muscle use. Cardiac: Regular rate and rhythm.  Back: No CVA tenderness Extremities: No peripheral edema.  Strong peripheral pulses.  Mental Status: No depression, anxiety, nor agitation. Logical though process. Skin: Warm and dry.  Assessment & Plan: Banita was seen today for dysuria.  Diagnoses and associated orders for this visit:  Dysuria - Urinalysis Dipstick - Urine Culture - ciprofloxacin (CIPRO) 250 MG tablet; Take one by mouth twice a day for five days.    Dysuria with nitrates in her urine highly suggestive of UTI therefore start Cipro based on prior urine cultures. Will follow new culture.Signs and symptoms requring emergent/urgent reevaluation were discussed with the patient.   Return if symptoms worsen or fail to improve.

## 2014-02-25 LAB — URINE CULTURE
COLONY COUNT: NO GROWTH
ORGANISM ID, BACTERIA: NO GROWTH

## 2014-02-26 ENCOUNTER — Telehealth: Payer: Self-pay | Admitting: Family Medicine

## 2014-02-26 MED ORDER — CEPHALEXIN 500 MG PO CAPS: 500.0000 mg | ORAL_CAPSULE | Freq: Four times a day (QID) | ORAL | Status: DC

## 2014-02-26 NOTE — Telephone Encounter (Signed)
Ms Romito called and is still not feeling well. She said she was seen Wed and if not feeling better by today to call.  She wants you to please return her call when you have a moment. - CF

## 2014-02-26 NOTE — Telephone Encounter (Signed)
Patient advised to stop the Cipro and to start the Keflex as directed.

## 2014-02-26 NOTE — Telephone Encounter (Signed)
New rx of keflex sent to her [facility]

## 2014-03-04 NOTE — Telephone Encounter (Signed)
Pt informed of Dr. Lajoyce Lauber recommendations and appt made for Monday pt stated that she will cancel if she feels better. Karen Ball, Karen Ball

## 2014-03-04 NOTE — Telephone Encounter (Signed)
Pt called and stated that she is having abdominal pains since starting new ABX. She said that she called the pharmacist and was told to try gaviscon for her pain and take the Keflex 2 hours afterwards. She stated that this has helped her however she reports that she began having the same pain and took the gaviscon again this morning and her stomach feels better she wanted to know what she should do?Maryruth Eve, Lahoma Crocker

## 2014-03-04 NOTE — Telephone Encounter (Signed)
I would recommend continuing the keflex and gaviscon and return for a follow up appt at her convenience for further evaluation.

## 2014-03-08 ENCOUNTER — Ambulatory Visit (INDEPENDENT_AMBULATORY_CARE_PROVIDER_SITE_OTHER): Payer: Medicare HMO | Admitting: Family Medicine

## 2014-03-08 ENCOUNTER — Encounter: Payer: Self-pay | Admitting: Family Medicine

## 2014-03-08 ENCOUNTER — Other Ambulatory Visit: Payer: Self-pay | Admitting: Family Medicine

## 2014-03-08 VITALS — BP 152/58 | HR 75 | Temp 96.9°F

## 2014-03-08 DIAGNOSIS — R109 Unspecified abdominal pain: Secondary | ICD-10-CM

## 2014-03-08 DIAGNOSIS — R21 Rash and other nonspecific skin eruption: Secondary | ICD-10-CM

## 2014-03-08 DIAGNOSIS — R1030 Lower abdominal pain, unspecified: Secondary | ICD-10-CM

## 2014-03-08 NOTE — Progress Notes (Signed)
   Subjective:    Patient ID: Karen Ball, female    DOB: 06/24/1918, 80 y.o.   MRN: [id number]  Abdominal Pain   She was seen for dysuria approximately 12 days ago by one of my partners. She was started on Keflex for a urinary tract infection. Urine culture came back negative but she was encouraged to complete the full course. She was experiencing some abdominal pain last week but that has actually improved over the weekend. No nausea vomiting or diarrhea. She does have a history of irritable bowel syndrome. She has about 2 more times the Keflex today so she will finish that today.  Has a rash on hre neck that comes and goes. Started about a week ago.  It is itchey. Steroid cream helps.  Hx of allergies.  She says years ago awhen her allergies were severe she used to get rashes all the time.   Review of Systems  Gastrointestinal: Positive for abdominal pain.       Objective:   Physical Exam  Constitutional: She is oriented to person, place, and time. She appears well-developed and well-nourished.  HENT:  Head: Normocephalic and atraumatic.  Cardiovascular: Normal rate.   Pulmonary/Chest: Effort normal and breath sounds normal.  Abdominal: Soft. Bowel sounds are normal. She exhibits no distension. There is no tenderness. There is no rebound and no guarding.  Neurological: She is alert and oriented to person, place, and time.  Skin: Skin is warm and dry.  Psychiatric: She has a normal mood and affect. Her behavior is normal.          Assessment & Plan:  Rash- likey eczema.  Will send scarping for KOH evaluation. Will call with results.  Will to a topical steroid cream if the KOH scraping is negative.  Abdominal pain-just above the suprapubic area. She was unable to give a repeat urine sample today. She has 2 more The Keflex and I encouraged her to complete that. I sent her home with a urine cup to collect a specimen she is able to. She is nontender on exam today. No change in bowel  movements and no fevers, chills, or sweats to suggest other infectious etiology.

## 2014-03-09 ENCOUNTER — Other Ambulatory Visit: Payer: Self-pay | Admitting: Family Medicine

## 2014-03-09 LAB — KOH PREP: RESULT - KOH: NONE SEEN

## 2014-03-09 MED ORDER — TRIAMCINOLONE 0.1 % CREAM:EUCERIN CREAM 1:1: 1.0000 "application " | TOPICAL_CREAM | Freq: Every day | CUTANEOUS | Status: DC | PRN

## 2014-03-15 ENCOUNTER — Ambulatory Visit (INDEPENDENT_AMBULATORY_CARE_PROVIDER_SITE_OTHER): Payer: Medicare HMO | Admitting: Family Medicine

## 2014-03-15 ENCOUNTER — Encounter: Payer: Self-pay | Admitting: Family Medicine

## 2014-03-15 VITALS — BP 166/80 | HR 98

## 2014-03-15 DIAGNOSIS — I10 Essential (primary) hypertension: Secondary | ICD-10-CM

## 2014-03-15 DIAGNOSIS — J069 Acute upper respiratory infection, unspecified: Secondary | ICD-10-CM

## 2014-03-15 DIAGNOSIS — M25559 Pain in unspecified hip: Secondary | ICD-10-CM

## 2014-03-15 DIAGNOSIS — E871 Hypo-osmolality and hyponatremia: Secondary | ICD-10-CM

## 2014-03-15 MED ORDER — AMLODIPINE BESYLATE 5 MG PO TABS: 5.0000 mg | ORAL_TABLET | Freq: Every day | ORAL | Status: DC

## 2014-03-15 NOTE — Patient Instructions (Signed)
Keep fluids around 65 ounces per day.   Got to the lab later this week to recheck the sodium.

## 2014-03-15 NOTE — Progress Notes (Signed)
Subjective:    Patient ID: Karen Ball, female    DOB: 19-Apr-1918, 80 y.o.   MRN: [id number]  Hypertension   ED f/u from [facility] from 03/12/14.   She was having a tapping sensation in her groin last Thursday..  Finally went away when she went to bed. Then on Friday had pain in her pelvic area but that has resolved.  Has had 3 Bm this AM. No burning with urination. No hematuria.  No fever, chills or sweats.  Over the weekend she hd runny nose, cough and nasal congestion.  Took an OTC allregy pill nad that has helped.  She had BP o 199/87 on arrival and was hyponatremia. Sodium was 125.  Hgb was 12.0.    Review of Systems  BP 166/80  Pulse 98  SpO2 96%    Allergies  Allergen Reactions  . Bactrim [Sulfamethoxazole-Tmp Ds]     Nausea/hyponatremia  . Iodinated Diagnostic Agents   . Pollen Extract     Past Medical History  Diagnosis Date  . Hearing loss     bilateral hearing aids  . Hypertension   . IBS (irritable bowel syndrome)     Past Surgical History  Procedure Laterality Date  . Abdominal hysterectomy    . Cholecystectomy    . Tonsillectomy    . Cataracts      bilateral    History   Social History  . Marital Status: Widowed    Spouse Name: N/A    Number of Children: N/A  . Years of Education: N/A   Occupational History  . Not on file.   Social History Main Topics  . Smoking status: Never Smoker   . Smokeless tobacco: Not on file  . Alcohol Use: No  . Drug Use: No  . Sexual Activity: Not on file     Comment: lives alone, 2 children, retired.   Other Topics Concern  . Not on file   Social History Narrative  . No narrative on file    Family History  Problem Relation Age of Onset  . Stroke Mother   . Heart attack Father   . GI problems Father   . Heart attack Brother     Outpatient Encounter Prescriptions as of 03/15/2014  Medication Sig  . albuterol (PROVENTIL HFA;VENTOLIN HFA) 108 (90 BASE) MCG/ACT inhaler Inhale 2 puffs into the lungs every  4 (four) hours as needed.    . ALPRAZolam (XANAX) 0.5 MG tablet TAKE 1/2-1 TABLET BY MOUTH EVERY DAY AS NEEDED FOR ANXIETY  . amLODipine (NORVASC) 5 MG tablet Take 1 tablet (5 mg total) by mouth daily.  Marland Kitchen aspirin 81 MG tablet Take 81 mg by mouth daily.    . betamethasone dipropionate (DIPROLENE) 0.05 % cream Apply topically 2 (two) times daily.  . diphenoxylate-atropine (LOMOTIL) 2.5-0.025 MG per tablet TAKE 1 TABLET BY MOUTH FOUR TIMES DAILY AS NEEDED FOR DIARRHIA OR LOOSE STOOLS  . ESTRACE VAGINAL 0.1 MG/GM vaginal cream   . olmesartan (BENICAR) 40 MG tablet Take 1 tablet by mouth   daily  . ondansetron (ZOFRAN-ODT) 4 MG disintegrating tablet Take one tablet Every 4-8 hours as needed for nausea.  . OXYTROL 3.9 MG/24HR APPLY 1 PATCH 1 TO 2 TIMES A WEEK AS DIRECTED  . raloxifene (EVISTA) 60 MG tablet TAKE 1 TABLET BY MOUTH DAILY  . Triamcinolone Acetonide (TRIAMCINOLONE 0.1 % CREAM : EUCERIN) CREA Apply 1 application topically daily as needed for rash.  . [DISCONTINUED] cephALEXin (KEFLEX) 500 MG capsule Take  1 capsule (500 mg total) by mouth 4 (four) times daily.          Objective:   Physical Exam  Constitutional: She is oriented to person, place, and time. She appears well-developed and well-nourished.  HENT:  Head: Normocephalic and atraumatic.  Cardiovascular: Normal rate, regular rhythm and normal heart sounds.   Pulmonary/Chest: Effort normal and breath sounds normal.  Neurological: She is alert and oriented to person, place, and time.  Skin: Skin is warm and dry.  Psychiatric: She has a normal mood and affect. Her behavior is normal.          Assessment & Plan:  Hypertension-improved today compared to admission at the emergency department. I will go ahead and add amlodipine 5 mg to her regimen. We'll have to monitor for lower Schimke swelling on this. She did take a blood pressure medication years ago and had side effects on it because she can't remember the name of it.  Followup in 4-6 weeks. Reassured her that this would not affect her electrolytes.  Hyponatremia-due to recheck sodium level at the end of the week. They did give her bag her sodium.  Discussed fluid restricting to 65 ounces per day. This is somewhat difficult because often we tell her to increase her fluids because she does get frequent urinary tract infections.  Pelvic pressure/pain-still awaiting urine culture from last Friday. He still says pending our computer system and hopefully we'll have the results back tomorrow and can give her a call and let her neck.  URI/AR - seesm to be feeling better today after taking allergy medicine.  Call if getting worse, fevers chills or sweats, or increased sputum production. He now she's getting a little bit of sputum but it's clear.

## 2014-03-16 LAB — URINE CULTURE

## 2014-03-18 ENCOUNTER — Encounter: Payer: Self-pay | Admitting: Family Medicine

## 2014-03-18 ENCOUNTER — Ambulatory Visit (INDEPENDENT_AMBULATORY_CARE_PROVIDER_SITE_OTHER): Payer: Medicare HMO | Admitting: Family Medicine

## 2014-03-18 VITALS — BP 160/73 | HR 99 | Temp 98.0°F

## 2014-03-18 DIAGNOSIS — R300 Dysuria: Secondary | ICD-10-CM

## 2014-03-18 DIAGNOSIS — R319 Hematuria, unspecified: Secondary | ICD-10-CM

## 2014-03-18 DIAGNOSIS — R197 Diarrhea, unspecified: Secondary | ICD-10-CM

## 2014-03-18 LAB — POCT URINALYSIS DIPSTICK
Bilirubin, UA: NEGATIVE
Glucose, UA: NEGATIVE
Ketones, UA: NEGATIVE
Nitrite, UA: NEGATIVE
PROTEIN UA: 30
Urobilinogen, UA: 0.2
pH, UA: 6.5

## 2014-03-18 MED ORDER — CIPROFLOXACIN HCL 500 MG PO TABS: 500.0000 mg | ORAL_TABLET | Freq: Two times a day (BID) | ORAL | Status: AC

## 2014-03-18 NOTE — Progress Notes (Signed)
   Subjective:    Patient ID: Karen Ball, female    DOB: 09/27/1917, 80 y.o.   MRN: [id number]  Dysuria    Pelvic pressure and pain intermittant. No nknown triggers. Feels like could be a UTI.  No fever or chillls.  Says pain can be sharp at times. She says she really doesn't have described the discomfort. She has been using topical estrogen cream for vaginal atrophy as prescribed by her urologist, Dr. Kathyrn Lass.  Diarrhea for a few weeks.  On and off.  Has had 8 BMs today and feels weak and worn out. No blood in the stool.  Has had several course of antibiotics in the last several monhts. No nausea or vomiting.  No known sick contacts. She is having anywhere between 4-8 stools per day.   Review of Systems  Genitourinary: Positive for dysuria.      Objective:   Physical Exam  Constitutional: She is oriented to person, place, and time. She appears well-developed and well-nourished.  HENT:  Head: Normocephalic and atraumatic.  Neurological: She is alert and oriented to person, place, and time.  Skin: Skin is dry.  Psychiatric: She has a normal mood and affect. Her behavior is normal.          Assessment & Plan:  UTI-will go ahead and start her on ciprofloxacin. She did not do well with the Keflex it causes GI upset. We'll send urine for culture for further evaluation. She's had some urine for the past that have come back negative for urine culture. Since she has had some recurrent symptoms and infection I do think she should try to get back in with Dr. Kathyrn Lass to address this specific issue.  Diarrhea- unclear etiology. She has been on multiple courses of antibiotics over the last couple months for UTIs. I recommend a stool culture and to evaluate for C. difficile. She's not had any fever which is reassuring she is also not had any blood in the stool. No one around her has had diarrhea either.   Will work on getting her in with Dr. Kathyrn Lass.

## 2014-03-19 LAB — BASIC METABOLIC PANEL
BUN: 13 mg/dL (ref 6–23)
CHLORIDE: 94 meq/L — AB (ref 96–112)
CO2: 23 mEq/L (ref 19–32)
Calcium: 9.1 mg/dL (ref 8.4–10.5)
Creat: 1.1 mg/dL (ref 0.50–1.10)
GLUCOSE: 173 mg/dL — AB (ref 70–99)
Potassium: 4 mEq/L (ref 3.5–5.3)
Sodium: 128 mEq/L — ABNORMAL LOW (ref 135–145)

## 2014-03-21 LAB — URINE CULTURE

## 2014-03-23 LAB — CLOSTRIDIUM DIFFICILE BY PCR: CDIFFPCR: NOT DETECTED

## 2014-03-26 LAB — STOOL CULTURE

## 2014-03-29 ENCOUNTER — Ambulatory Visit (INDEPENDENT_AMBULATORY_CARE_PROVIDER_SITE_OTHER): Payer: Medicare HMO | Admitting: Family Medicine

## 2014-03-29 ENCOUNTER — Encounter: Payer: Self-pay | Admitting: Family Medicine

## 2014-03-29 VITALS — BP 144/82 | HR 72 | Temp 97.7°F | Wt 111.0 lb

## 2014-03-29 DIAGNOSIS — R05 Cough: Secondary | ICD-10-CM

## 2014-03-29 DIAGNOSIS — J209 Acute bronchitis, unspecified: Secondary | ICD-10-CM

## 2014-03-29 DIAGNOSIS — E871 Hypo-osmolality and hyponatremia: Secondary | ICD-10-CM

## 2014-03-29 DIAGNOSIS — R197 Diarrhea, unspecified: Secondary | ICD-10-CM

## 2014-03-29 DIAGNOSIS — R059 Cough, unspecified: Secondary | ICD-10-CM

## 2014-03-29 MED ORDER — AZITHROMYCIN 250 MG PO TABS: ORAL_TABLET | ORAL | Status: DC

## 2014-03-29 NOTE — Progress Notes (Signed)
   Subjective:    Patient ID: Karen Ball, female    DOB: 01-Oct-1917, 80 y.o.   MRN: [id number]  Cough   Cough x 1 month - productive cough with green sputum.  No fever, chills, or sweats.  She has been taking a probiotic.  Says initially was getting better but now feels it is getting wose today. No wheezing.  No sore throat.  Hypertension-blood pressure has been elevated last time she's been here.  F/U diarrhea - I saw her about 2 weeks ago she was having about 8 stools per day. She had a negative stool culture and negative C. Difficile. Does have IBU. Using her lomotil prn.    Hyponatremia - Has really increased her sodium in her diet.    Review of Systems  Respiratory: Positive for cough.        Objective:   Physical Exam  Constitutional: She is oriented to person, place, and time. She appears well-developed and well-nourished.  HENT:  Head: Normocephalic and atraumatic.  Cardiovascular: Normal rate, regular rhythm and normal heart sounds.   Pulmonary/Chest: Effort normal and breath sounds normal.  Neurological: She is alert and oriented to person, place, and time.  Skin: Skin is warm and dry.  Psychiatric: She has a normal mood and affect. Her behavior is normal.          Assessment & Plan:  Cough - Bronchitis vs early Pneumonia.  Cough is very deep aand forcefull bc she was getting better and now suddenly worse will tx with antibiotics. Given zpack.  F/U if more SOB, develops a fever, or getting worse.   Diarrhea - Can use immodium prn. Can use lomotil prn.  Likely from her IBS.    Hypertension-at goal today. Followup in 2-3 months.  Hyponatremia - Will recheck level today.

## 2014-03-30 LAB — CBC WITH DIFFERENTIAL/PLATELET
BASOS ABS: 0.1 10*3/uL (ref 0.0–0.1)
BASOS PCT: 1 % (ref 0–1)
Eosinophils Absolute: 0.2 10*3/uL (ref 0.0–0.7)
Eosinophils Relative: 3 % (ref 0–5)
HEMATOCRIT: 39.4 % (ref 36.0–46.0)
Hemoglobin: 13 g/dL (ref 12.0–15.0)
LYMPHS PCT: 28 % (ref 12–46)
Lymphs Abs: 2.1 10*3/uL (ref 0.7–4.0)
MCH: 31 pg (ref 26.0–34.0)
MCHC: 33 g/dL (ref 30.0–36.0)
MCV: 94 fL (ref 78.0–100.0)
Monocytes Absolute: 0.4 10*3/uL (ref 0.1–1.0)
Monocytes Relative: 5 % (ref 3–12)
NEUTROS PCT: 63 % (ref 43–77)
Neutro Abs: 4.7 10*3/uL (ref 1.7–7.7)
Platelets: 337 10*3/uL (ref 150–400)
RBC: 4.19 MIL/uL (ref 3.87–5.11)
RDW: 13.1 % (ref 11.5–15.5)
WBC: 7.5 10*3/uL (ref 4.0–10.5)

## 2014-03-30 LAB — BASIC METABOLIC PANEL WITH GFR
BUN: 21 mg/dL (ref 6–23)
CHLORIDE: 100 meq/L (ref 96–112)
CO2: 25 mEq/L (ref 19–32)
Calcium: 9.1 mg/dL (ref 8.4–10.5)
Creat: 1.25 mg/dL — ABNORMAL HIGH (ref 0.50–1.10)
GFR, EST AFRICAN AMERICAN: 43 mL/min — AB
GFR, EST NON AFRICAN AMERICAN: 37 mL/min — AB
Glucose, Bld: 218 mg/dL — ABNORMAL HIGH (ref 70–99)
POTASSIUM: 4.2 meq/L (ref 3.5–5.3)
Sodium: 135 mEq/L (ref 135–145)

## 2014-05-17 ENCOUNTER — Other Ambulatory Visit: Payer: Self-pay | Admitting: Family Medicine

## 2014-06-28 ENCOUNTER — Other Ambulatory Visit: Payer: Self-pay | Admitting: Family Medicine

## 2014-07-12 ENCOUNTER — Ambulatory Visit (INDEPENDENT_AMBULATORY_CARE_PROVIDER_SITE_OTHER): Payer: Commercial Managed Care - HMO | Admitting: Physician Assistant

## 2014-07-12 ENCOUNTER — Encounter: Payer: Self-pay | Admitting: Physician Assistant

## 2014-07-12 ENCOUNTER — Telehealth: Payer: Self-pay | Admitting: *Deleted

## 2014-07-12 VITALS — BP 120/61 | HR 83 | Ht 60.0 in | Wt 111.0 lb

## 2014-07-12 DIAGNOSIS — R300 Dysuria: Secondary | ICD-10-CM | POA: Diagnosis not present

## 2014-07-12 DIAGNOSIS — R350 Frequency of micturition: Secondary | ICD-10-CM | POA: Diagnosis not present

## 2014-07-12 DIAGNOSIS — K5900 Constipation, unspecified: Secondary | ICD-10-CM | POA: Diagnosis not present

## 2014-07-12 NOTE — Telephone Encounter (Signed)
I called Karen Ball and let her know that she would need to bring another urine specimen to the office as I was unable to run with what she provided in the hat provided. There was nothing in the hat that I was able to use for clinitek.

## 2014-07-12 NOTE — Progress Notes (Signed)
   Subjective:    Patient ID: Karen Ball, female    DOB: 01/01/1918, 81 y.o.   MRN: [id number]  HPI  Pt presents to the clinic with a friend for some urinary frequency and discomfort. She notice over the weekend symptoms but she was also constipation. She had a few bowel movement and urinary symptoms improved. She then had a day of multiple loose stools and took lomotil. She has not had a bowel movement in 24 hours until this morning. Last night she was up all night urinating with some mild dysfunction. She took AZO this am. She is eating without difficultly. She denies any blood in stool. No flank pain. No nausea. Some slight pain of lower left quadrant.     Review of Systems  All other systems reviewed and are negative.      Objective:   Physical Exam  Constitutional: She is oriented to person, place, and time. She appears well-developed and well-nourished.  HENT:  Head: Normocephalic and atraumatic.  Cardiovascular: Normal rate, regular rhythm and normal heart sounds.   Pulmonary/Chest: Effort normal and breath sounds normal.  No CVA tenderness.   Abdominal:  Left lower quadrant but more in inguinal area tenderness.  No guarding or rebound tenderness.   Neurological: She is alert and oriented to person, place, and time.  Skin: Skin is dry.  Psychiatric: She has a normal mood and affect. Her behavior is normal.          Assessment & Plan:  Dysuria/urinary frequency/constipation- pt not able to collect sample today. Pt given cup to collect and return in am. i feel that constipation could be causing urgency symptoms.pt urged not to take lomotil unless have more than one day of multiple loose stools. It could be constipating her as well.  Discussed to stop AZO. Call if symptoms changing or worsening. Drink water and stay hydrated.  Follow up as needed.

## 2014-07-12 NOTE — Patient Instructions (Addendum)
Stop AZO.  Get me a clean catch urine in the morning.  Stay hydrated drinking lots of water.  Call office if develops a fever or worsening symptoms.

## 2014-07-29 ENCOUNTER — Other Ambulatory Visit: Payer: Self-pay | Admitting: Family Medicine

## 2014-07-29 NOTE — Telephone Encounter (Signed)
Due for f/u

## 2014-08-09 ENCOUNTER — Ambulatory Visit: Payer: Medicare HMO | Admitting: Family Medicine

## 2014-08-10 ENCOUNTER — Other Ambulatory Visit: Payer: Self-pay | Admitting: *Deleted

## 2014-08-10 MED ORDER — RALOXIFENE HCL 60 MG PO TABS: ORAL_TABLET | ORAL | Status: DC

## 2014-08-19 ENCOUNTER — Encounter: Payer: Self-pay | Admitting: Family Medicine

## 2014-08-19 ENCOUNTER — Ambulatory Visit (INDEPENDENT_AMBULATORY_CARE_PROVIDER_SITE_OTHER): Payer: Commercial Managed Care - HMO | Admitting: Family Medicine

## 2014-08-19 VITALS — BP 135/58 | HR 78 | Wt 113.0 lb

## 2014-08-19 DIAGNOSIS — Z780 Asymptomatic menopausal state: Secondary | ICD-10-CM

## 2014-08-19 DIAGNOSIS — I10 Essential (primary) hypertension: Secondary | ICD-10-CM | POA: Diagnosis not present

## 2014-08-19 DIAGNOSIS — R7301 Impaired fasting glucose: Secondary | ICD-10-CM | POA: Diagnosis not present

## 2014-08-19 DIAGNOSIS — K589 Irritable bowel syndrome without diarrhea: Secondary | ICD-10-CM

## 2014-08-19 DIAGNOSIS — M810 Age-related osteoporosis without current pathological fracture: Secondary | ICD-10-CM

## 2014-08-19 LAB — POCT GLYCOSYLATED HEMOGLOBIN (HGB A1C): Hemoglobin A1C: 5

## 2014-08-19 MED ORDER — DIPHENOXYLATE-ATROPINE 2.5-0.025 MG PO TABS: ORAL_TABLET | ORAL | Status: DC

## 2014-08-19 NOTE — Progress Notes (Signed)
   Subjective:    Patient ID: Karen Ball, female    DOB: [birth year], 81 y.o.   MRN: [id number]  HPI Hypertension- Pt denies chest pain, SOB, dizziness, or heart palpitations.  Taking meds as directed w/o problems.  Denies medication side effects.    IFG - No inc thirst or urination.   Irritable bowel syndrome, diarrhea predominant-she just uses the Lomotil sparingly.  Needs refill today.    She also needs a refill on her Evista. We took over this prescription for her a little over a year ago. Her last bone density was probably 9 years ago. She says she was put on it for her bone health at that time. She was originally on Premarin for some time after her hysterectomy.  Review of Systems     Objective:   Physical Exam  Constitutional: She is oriented to person, place, and time. She appears well-developed and well-nourished.  HENT:  Head: Normocephalic and atraumatic.  Cardiovascular: Normal rate, regular rhythm and normal heart sounds.   Pulmonary/Chest: Effort normal and breath sounds normal.  Neurological: She is alert and oriented to person, place, and time.  Skin: Skin is warm and dry.  Psychiatric: She has a normal mood and affect. Her behavior is normal.          Assessment & Plan:  HTN - well controlled.  Continue current regimen for follow up in 6 months. Want future BP meds sent to mail order.    IFG - well controlled. A1C is down to 5.0 today.  F/U in 6  months.   IBS, diarrhea predominant. Uses lomotil sparingly.  Will refill today.    Osteoporosis - due for bone density. Will refill Evista.

## 2014-09-01 ENCOUNTER — Other Ambulatory Visit: Payer: Self-pay | Admitting: *Deleted

## 2014-09-01 MED ORDER — AMLODIPINE BESYLATE 5 MG PO TABS: 5.0000 mg | ORAL_TABLET | Freq: Every day | ORAL | Status: DC

## 2014-09-01 MED ORDER — OLMESARTAN MEDOXOMIL 40 MG PO TABS: 40.0000 mg | ORAL_TABLET | Freq: Every day | ORAL | Status: DC

## 2014-09-03 ENCOUNTER — Other Ambulatory Visit: Payer: Self-pay | Admitting: Family Medicine

## 2014-09-03 ENCOUNTER — Other Ambulatory Visit: Payer: Self-pay

## 2014-09-03 DIAGNOSIS — M810 Age-related osteoporosis without current pathological fracture: Secondary | ICD-10-CM

## 2014-09-03 MED ORDER — RALOXIFENE HCL 60 MG PO TABS
ORAL_TABLET | ORAL | Status: DC
Start: 2014-09-03 — End: 2015-03-09

## 2014-09-13 ENCOUNTER — Other Ambulatory Visit: Payer: Self-pay | Admitting: *Deleted

## 2014-09-13 ENCOUNTER — Other Ambulatory Visit: Payer: Self-pay | Admitting: Family Medicine

## 2014-09-13 MED ORDER — OLMESARTAN MEDOXOMIL 40 MG PO TABS: 40.0000 mg | ORAL_TABLET | Freq: Every day | ORAL | Status: DC

## 2014-09-13 MED ORDER — AMLODIPINE BESYLATE 5 MG PO TABS: 5.0000 mg | ORAL_TABLET | Freq: Every day | ORAL | Status: DC

## 2014-09-13 NOTE — Telephone Encounter (Signed)
Spoke w/pharmacist at [facility] and informed her that this is ONLY to be a 10 days supply, she told me that the pt would have to pay cash price for this medication due to the refill being too early and it being sent to her mail order.Audelia Hives [place]

## 2014-12-21 ENCOUNTER — Ambulatory Visit (INDEPENDENT_AMBULATORY_CARE_PROVIDER_SITE_OTHER): Payer: Medicare HMO | Admitting: Family Medicine

## 2014-12-21 VITALS — BP 151/64 | HR 78 | Wt 109.0 lb

## 2014-12-21 DIAGNOSIS — H6120 Impacted cerumen, unspecified ear: Secondary | ICD-10-CM

## 2014-12-21 NOTE — Progress Notes (Signed)
Indication: Cerumen impaction of the left ear Medical necessity statement: On physical examination, cerumen impairs clinically significant portions of the external auditory canal, and tympanic membrane. Noted obstructive, copious cerumen that cannot be removed without magnification and instrumentations requiring physician skills Consent: Discussed benefits and risks of procedure and verbal consent obtained Procedure: Patient was prepped for the procedure. Utilized an otoscope to assess and take note of the ear canal, the tympanic membrane, and the presence, amount, and placement of the cerumen. Gentle water irrigation and soft plastic curette was needed for me to fully remove cerumen.  Post procedure examination: shows cerumen was completely removed. Patient tolerated procedure well. The patient is made aware that they may experience temporary vertigo, temporary hearing loss, and temporary discomfort. If these symptom last for more than 24 hours to call the clinic or proceed to the ED.   Hearing improved.

## 2014-12-21 NOTE — Progress Notes (Signed)
Patient came into clinic today for cerumen removal in left ear. Pt states her "hearing aids wont fit right with the wax in there." Left ear was irrigated with warm water, wax was removed. Patient tolerated procedure well, with no immediate complications. Patient advised to contact us with any further questions. Was very thankful for the help today.

## 2014-12-28 ENCOUNTER — Other Ambulatory Visit: Payer: Self-pay | Admitting: Family Medicine

## 2015-01-05 ENCOUNTER — Other Ambulatory Visit: Payer: Self-pay | Admitting: Family Medicine

## 2015-01-11 ENCOUNTER — Ambulatory Visit: Payer: Medicare HMO | Admitting: Family Medicine

## 2015-01-13 ENCOUNTER — Ambulatory Visit: Payer: Medicare HMO | Admitting: Family Medicine

## 2015-01-21 ENCOUNTER — Ambulatory Visit (INDEPENDENT_AMBULATORY_CARE_PROVIDER_SITE_OTHER): Payer: Medicare HMO | Admitting: Family Medicine

## 2015-01-21 ENCOUNTER — Other Ambulatory Visit: Payer: Self-pay | Admitting: Family Medicine

## 2015-01-21 ENCOUNTER — Encounter: Payer: Self-pay | Admitting: Family Medicine

## 2015-01-21 VITALS — BP 135/54 | HR 89 | Ht 60.0 in | Wt 107.0 lb

## 2015-01-21 DIAGNOSIS — M545 Low back pain, unspecified: Secondary | ICD-10-CM

## 2015-01-21 DIAGNOSIS — I440 Atrioventricular block, first degree: Secondary | ICD-10-CM

## 2015-01-21 DIAGNOSIS — R609 Edema, unspecified: Secondary | ICD-10-CM | POA: Diagnosis not present

## 2015-01-21 DIAGNOSIS — R600 Localized edema: Secondary | ICD-10-CM | POA: Diagnosis not present

## 2015-01-21 LAB — CBC WITH DIFFERENTIAL/PLATELET
BASOS ABS: 0.1 10*3/uL (ref 0.0–0.1)
BASOS PCT: 1 % (ref 0–1)
EOS ABS: 0.3 10*3/uL (ref 0.0–0.7)
EOS PCT: 6 % — AB (ref 0–5)
HEMATOCRIT: 39.6 % (ref 36.0–46.0)
HEMOGLOBIN: 13.4 g/dL (ref 12.0–15.0)
LYMPHS PCT: 40 % (ref 12–46)
Lymphs Abs: 2.3 10*3/uL (ref 0.7–4.0)
MCH: 31.8 pg (ref 26.0–34.0)
MCHC: 33.8 g/dL (ref 30.0–36.0)
MCV: 94.1 fL (ref 78.0–100.0)
MONO ABS: 0.5 10*3/uL (ref 0.1–1.0)
MPV: 9.9 fL (ref 8.6–12.4)
Monocytes Relative: 8 % (ref 3–12)
NEUTROS ABS: 2.6 10*3/uL (ref 1.7–7.7)
Neutrophils Relative %: 45 % (ref 43–77)
Platelets: 239 10*3/uL (ref 150–400)
RBC: 4.21 MIL/uL (ref 3.87–5.11)
RDW: 13.7 % (ref 11.5–15.5)
WBC: 5.7 10*3/uL (ref 4.0–10.5)

## 2015-01-21 LAB — COMPLETE METABOLIC PANEL WITH GFR
ALT: 11 U/L (ref 6–29)
AST: 17 U/L (ref 10–35)
Albumin: 3.7 g/dL (ref 3.6–5.1)
Alkaline Phosphatase: 54 U/L (ref 33–130)
BUN: 22 mg/dL (ref 7–25)
CALCIUM: 9.7 mg/dL (ref 8.6–10.4)
CO2: 28 mmol/L (ref 20–31)
CREATININE: 1.33 mg/dL — AB (ref 0.60–0.88)
Chloride: 101 mmol/L (ref 98–110)
GFR, EST AFRICAN AMERICAN: 39 mL/min — AB (ref >=60)
GFR, EST NON AFRICAN AMERICAN: 34 mL/min — AB (ref >=60)
GLUCOSE: 81 mg/dL (ref 65–99)
Potassium: 4.5 mmol/L (ref 3.5–5.3)
SODIUM: 136 mmol/L (ref 135–146)
Total Bilirubin: 0.5 mg/dL (ref 0.2–1.2)
Total Protein: 6.3 g/dL (ref 6.1–8.1)

## 2015-01-21 LAB — POCT URINALYSIS DIPSTICK
BILIRUBIN UA: NEGATIVE
Blood, UA: NEGATIVE
Glucose, UA: NEGATIVE
KETONES UA: NEGATIVE
Nitrite, UA: NEGATIVE
Protein, UA: NEGATIVE
Spec Grav, UA: 1.01
UROBILINOGEN UA: 0.2
pH, UA: 6.5

## 2015-01-21 NOTE — Progress Notes (Signed)
Subjective:    Patient ID: Karen Ball, female    DOB: 04-Jul-1917, 81 y.o.   MRN: [id number]  HPI C/O of bilateral leg swelling. Started in April while driving to [place] but has persitent.  Better in the AM and worse by theend of the day. No swelling in her hands.  + occ bloating. No CP or SOB.  Occ will get a sharp pain from the bottom of her feet up to her heart.  Last seconds and then gone.  Happens maybe once every 2 weeks. No medicine or diet changes. She last about 5 lbs.  No Urinary sxs.  No fever, chills.  She also had some low back pain over the weekend but has been little bit better.   Review of Systems BP 135/54 mmHg  Pulse 89  Ht 5' (1.524 m)  Wt 107 lb (48.535 kg)  BMI 20.90 kg/m2    Allergies  Allergen Reactions  . Bactrim [Sulfamethoxazole-Trimethoprim]     Nausea/hyponatremia  . Iodinated Diagnostic Agents   . Pollen Extract     Past Medical History  Diagnosis Date  . Hearing loss     bilateral hearing aids  . Hypertension   . IBS (irritable bowel syndrome)     Past Surgical History  Procedure Laterality Date  . Abdominal hysterectomy    . Cholecystectomy    . Tonsillectomy    . Cataracts      bilateral    History   Social History  . Marital Status: Widowed    Spouse Name: N/A  . Number of Children: N/A  . Years of Education: N/A   Occupational History  . Not on file.   Social History Main Topics  . Smoking status: Never Smoker   . Smokeless tobacco: Not on file  . Alcohol Use: No  . Drug Use: No  . Sexual Activity: Not on file     Comment: lives alone, 2 children, retired.   Other Topics Concern  . Not on file   Social History Narrative    Family History  Problem Relation Age of Onset  . Stroke Mother   . Heart attack Father   . GI problems Father   . Heart attack Brother     Outpatient Encounter Prescriptions as of 01/21/2015  Medication Sig  . albuterol (PROVENTIL HFA;VENTOLIN HFA) 108 (90 BASE) MCG/ACT inhaler Inhale 2  puffs into the lungs every 4 (four) hours as needed.    . ALPRAZolam (XANAX) 0.5 MG tablet TAKE 1-1/2 TABLETS BY MOUTH EVERY DAY AS NEEDED FOR ANXIETY  . amLODipine (NORVASC) 5 MG tablet Take 1 tablet (5 mg total) by mouth daily.  Marland Kitchen aspirin 81 MG tablet Take 81 mg by mouth daily.    . betamethasone dipropionate (DIPROLENE) 0.05 % cream Apply topically 2 (two) times daily.  . diphenoxylate-atropine (LOMOTIL) 2.5-0.025 MG per tablet TAKE 1 TABLET BY MOUTH FOUR TIMES DAILY AS NEEDED FOR DIARRHIA OR LOOSE STOOLS  . ESTRACE VAGINAL 0.1 MG/GM vaginal cream   . olmesartan (BENICAR) 40 MG tablet Take 1 tablet (40 mg total) by mouth daily.  . ondansetron (ZOFRAN-ODT) 4 MG disintegrating tablet Take one tablet Every 4-8 hours as needed for nausea.  . OXYTROL 3.9 MG/24HR APPLY 1 PATCH 1 TO 2 TIMES A WEEK AS DIRECTED  . raloxifene (EVISTA) 60 MG tablet Take 1 tablet by mouth daily  . Triamcinolone Acetonide (TRIAMCINOLONE 0.1 % CREAM : EUCERIN) CREA Apply 1 application topically daily as needed for rash.  No facility-administered encounter medications on file as of 01/21/2015.          Objective:   Physical Exam  Constitutional: She is oriented to person, place, and time. She appears well-developed and well-nourished.  HENT:  Head: Normocephalic and atraumatic.  Right Ear: External ear normal.  Left Ear: External ear normal.  Nose: Nose normal.  Mouth/Throat: Oropharynx is clear and moist.  TMs and canals are clear.   Eyes: Conjunctivae and EOM are normal. Pupils are equal, round, and reactive to light.  Neck: Neck supple. No thyromegaly present.  Cardiovascular: Normal rate, regular rhythm and normal heart sounds.   Pulmonary/Chest: Effort normal and breath sounds normal. She has no wheezes.  Abdominal: Soft. Bowel sounds are normal. She exhibits no distension and no mass. There is no tenderness. There is no rebound and no guarding.  Musculoskeletal: She exhibits edema.  Trace foot and ankle  edema bilaterally   Lymphadenopathy:    She has no cervical adenopathy.  Neurological: She is alert and oriented to person, place, and time.  Skin: Skin is warm and dry.  Psychiatric: She has a normal mood and affect.          Assessment & Plan:  Bilateral leg swelling. Unclear etiology at this point but has been persistent. DVT is unlikely since it is bilateral. She has lost 5 pounds since I saw her last winter. Will chew blood work to evaluate kidney and liver function. eval thyroid and for anemia. We'll also do an EKG today is her heart rate seems a little bit on regular irregular though she is not tachycardic. We'll also schedule her for an echocardiogram in the next week or 2. She's not having any chest pain or shortness of breath which is reassuring.   EKG today shows rate of 73 bpm, right axis deviation with first-degree AV block and right bundle branch block. Unchanged from previous in 2012.

## 2015-01-22 LAB — TSH: TSH: 7.667 u[IU]/mL — ABNORMAL HIGH (ref 0.350–4.500)

## 2015-01-26 LAB — T4, FREE: FREE T4: 1.04 ng/dL (ref 0.80–1.80)

## 2015-01-26 LAB — T3, FREE: T3, Free: 2.6 pg/mL (ref 2.3–4.2)

## 2015-02-01 ENCOUNTER — Other Ambulatory Visit (HOSPITAL_COMMUNITY): Payer: Medicare HMO

## 2015-02-07 ENCOUNTER — Other Ambulatory Visit: Payer: Self-pay

## 2015-02-07 ENCOUNTER — Ambulatory Visit (HOSPITAL_COMMUNITY): Payer: Commercial Managed Care - HMO | Attending: Cardiology

## 2015-02-07 DIAGNOSIS — I340 Nonrheumatic mitral (valve) insufficiency: Secondary | ICD-10-CM | POA: Insufficient documentation

## 2015-02-07 DIAGNOSIS — R609 Edema, unspecified: Secondary | ICD-10-CM | POA: Diagnosis present

## 2015-02-07 DIAGNOSIS — R600 Localized edema: Secondary | ICD-10-CM

## 2015-02-07 DIAGNOSIS — I10 Essential (primary) hypertension: Secondary | ICD-10-CM | POA: Diagnosis not present

## 2015-03-09 ENCOUNTER — Other Ambulatory Visit: Payer: Self-pay | Admitting: Family Medicine

## 2015-06-28 ENCOUNTER — Telehealth: Payer: Self-pay

## 2015-06-28 MED ORDER — LOSARTAN POTASSIUM 100 MG PO TABS: 100.0000 mg | ORAL_TABLET | Freq: Every day | ORAL | Status: DC

## 2015-06-28 NOTE — Telephone Encounter (Signed)
See other note

## 2015-06-28 NOTE — Telephone Encounter (Signed)
Ok will change to losartan.  Disregard previous phone note. Will send to mail order.

## 2015-06-28 NOTE — Telephone Encounter (Signed)
I am pretty sure Benicar went generic. May need to resend script as generic instead of Brand and see what cost will be.

## 2015-06-28 NOTE — Telephone Encounter (Signed)
Patients insurance does not cover benicar.  Per rx benefits alternative is losartan.  Please advise.

## 2015-06-28 NOTE — Telephone Encounter (Signed)
Patient's insurance changed and they will no longer cover Benicar. She needs something cheaper. Please advise.

## 2015-06-29 ENCOUNTER — Other Ambulatory Visit: Payer: Self-pay | Admitting: Family Medicine

## 2015-06-29 MED ORDER — LOSARTAN POTASSIUM 100 MG PO TABS: 100.0000 mg | ORAL_TABLET | Freq: Every day | ORAL | Status: DC

## 2015-07-19 ENCOUNTER — Other Ambulatory Visit: Payer: Self-pay | Admitting: Family Medicine

## 2015-07-22 ENCOUNTER — Encounter: Payer: Self-pay | Admitting: Family Medicine

## 2015-07-22 ENCOUNTER — Ambulatory Visit (INDEPENDENT_AMBULATORY_CARE_PROVIDER_SITE_OTHER): Payer: Commercial Managed Care - HMO | Admitting: Family Medicine

## 2015-07-22 VITALS — BP 161/65 | HR 82 | Wt 104.0 lb

## 2015-07-22 DIAGNOSIS — R634 Abnormal weight loss: Secondary | ICD-10-CM | POA: Diagnosis not present

## 2015-07-22 DIAGNOSIS — Z23 Encounter for immunization: Secondary | ICD-10-CM | POA: Diagnosis not present

## 2015-07-22 DIAGNOSIS — R197 Diarrhea, unspecified: Secondary | ICD-10-CM

## 2015-07-22 DIAGNOSIS — F411 Generalized anxiety disorder: Secondary | ICD-10-CM | POA: Diagnosis not present

## 2015-07-22 DIAGNOSIS — I10 Essential (primary) hypertension: Secondary | ICD-10-CM | POA: Diagnosis not present

## 2015-07-22 MED ORDER — ALPRAZOLAM 0.5 MG PO TABS: ORAL_TABLET | ORAL | Status: DC

## 2015-07-22 NOTE — Progress Notes (Signed)
   Subjective:    Patient ID: Karen Ball, female    DOB: [birth year], 82 y.o.   MRN: [id number]  HPI Hypertension- Pt denies chest pain, SOB, dizziness, or heart palpitations.  Taking meds as directed w/o problems.  Denies medication side effects.  She is not taking her amlodipine.  She is off Benicar and nis now on losartan.    Axniety  - she is using her xanax maybe once a week. Even then she typically splits the tablet. She feels like overall she is doing well and has not noticed any increase in symptoms recently.  Diarrhea - only using the Lomotil PRN.      Review of Systems     Objective:   Physical Exam  Constitutional: She is oriented to person, place, and time. She appears well-developed and well-nourished.  HENT:  Head: Normocephalic and atraumatic.  Cardiovascular: Normal rate, regular rhythm and normal heart sounds.   Pulmonary/Chest: Effort normal and breath sounds normal.  Neurological: She is alert and oriented to person, place, and time.  Skin: Skin is warm and dry.  Psychiatric: She has a normal mood and affect. Her behavior is normal.          Assessment & Plan:  Hypertension-not well controlled today but she actually has not been taking her amlodipine since September. She says she still has a bottle at home. Encouraged her to restart it and then follow-up in a month to recheck her blood pressure. Next  Anxiety-she does use her Xanax very sparingly about once a week and even then she usually splits the tablet in half. We'll go ahead and refill her Xanax today. Next  Diarrhea, intermittent-still using Lomotil as needed.  Abnormal weight loss-she has lost about 7 pounds in the last year. She has not noticed any changes in appetite. No nausea. She has noticed over the last 4 or 5 months that about once a month she'll feel like her food gets hung in the mid chest area. She'll then spit up some mucus. It is not happened persistently. She thought it might just be  from not chewing her food well. She has not had any pain. I discussed with her that we could refer her to GI for further evaluation. It could be a stricture. She wants to keep an eye on it. Certainly if it starts happening more frequently then we need to get this evaluated.  Flu vaccine given today.

## 2015-07-27 DIAGNOSIS — T18128A Food in esophagus causing other injury, initial encounter: Secondary | ICD-10-CM | POA: Diagnosis not present

## 2015-07-27 DIAGNOSIS — E871 Hypo-osmolality and hyponatremia: Secondary | ICD-10-CM | POA: Diagnosis not present

## 2015-07-27 DIAGNOSIS — K222 Esophageal obstruction: Secondary | ICD-10-CM | POA: Diagnosis not present

## 2015-07-27 DIAGNOSIS — K209 Esophagitis, unspecified: Secondary | ICD-10-CM | POA: Diagnosis not present

## 2015-07-27 DIAGNOSIS — T18108A Unspecified foreign body in esophagus causing other injury, initial encounter: Secondary | ICD-10-CM | POA: Diagnosis not present

## 2015-07-27 DIAGNOSIS — I700 Atherosclerosis of aorta: Secondary | ICD-10-CM | POA: Diagnosis not present

## 2015-07-27 DIAGNOSIS — I10 Essential (primary) hypertension: Secondary | ICD-10-CM | POA: Diagnosis not present

## 2015-07-27 DIAGNOSIS — I4891 Unspecified atrial fibrillation: Secondary | ICD-10-CM | POA: Diagnosis not present

## 2015-07-27 DIAGNOSIS — I440 Atrioventricular block, first degree: Secondary | ICD-10-CM | POA: Diagnosis not present

## 2015-07-27 DIAGNOSIS — K449 Diaphragmatic hernia without obstruction or gangrene: Secondary | ICD-10-CM | POA: Diagnosis not present

## 2015-07-27 DIAGNOSIS — K210 Gastro-esophageal reflux disease with esophagitis: Secondary | ICD-10-CM | POA: Diagnosis not present

## 2015-07-27 DIAGNOSIS — I129 Hypertensive chronic kidney disease with stage 1 through stage 4 chronic kidney disease, or unspecified chronic kidney disease: Secondary | ICD-10-CM | POA: Diagnosis not present

## 2015-07-27 DIAGNOSIS — R05 Cough: Secondary | ICD-10-CM | POA: Diagnosis not present

## 2015-07-27 DIAGNOSIS — I480 Paroxysmal atrial fibrillation: Secondary | ICD-10-CM | POA: Diagnosis not present

## 2015-07-27 DIAGNOSIS — K219 Gastro-esophageal reflux disease without esophagitis: Secondary | ICD-10-CM | POA: Diagnosis not present

## 2015-07-27 DIAGNOSIS — F419 Anxiety disorder, unspecified: Secondary | ICD-10-CM | POA: Diagnosis not present

## 2015-07-27 DIAGNOSIS — K226 Gastro-esophageal laceration-hemorrhage syndrome: Secondary | ICD-10-CM | POA: Diagnosis not present

## 2015-07-27 DIAGNOSIS — N183 Chronic kidney disease, stage 3 (moderate): Secondary | ICD-10-CM | POA: Diagnosis not present

## 2015-07-27 DIAGNOSIS — K440 Diaphragmatic hernia with obstruction, without gangrene: Secondary | ICD-10-CM | POA: Diagnosis not present

## 2015-07-27 DIAGNOSIS — R1310 Dysphagia, unspecified: Secondary | ICD-10-CM | POA: Diagnosis not present

## 2015-07-28 DIAGNOSIS — I10 Essential (primary) hypertension: Secondary | ICD-10-CM | POA: Diagnosis not present

## 2015-07-28 DIAGNOSIS — T18128A Food in esophagus causing other injury, initial encounter: Secondary | ICD-10-CM | POA: Diagnosis not present

## 2015-07-28 DIAGNOSIS — K222 Esophageal obstruction: Secondary | ICD-10-CM | POA: Diagnosis not present

## 2015-07-28 DIAGNOSIS — I4891 Unspecified atrial fibrillation: Secondary | ICD-10-CM | POA: Diagnosis not present

## 2015-07-28 DIAGNOSIS — R1310 Dysphagia, unspecified: Secondary | ICD-10-CM | POA: Diagnosis not present

## 2015-07-28 DIAGNOSIS — K449 Diaphragmatic hernia without obstruction or gangrene: Secondary | ICD-10-CM | POA: Diagnosis not present

## 2015-08-03 ENCOUNTER — Ambulatory Visit (INDEPENDENT_AMBULATORY_CARE_PROVIDER_SITE_OTHER): Payer: Commercial Managed Care - HMO | Admitting: Family Medicine

## 2015-08-03 ENCOUNTER — Encounter: Payer: Self-pay | Admitting: Family Medicine

## 2015-08-03 VITALS — BP 128/36 | HR 65 | Ht 60.0 in | Wt 104.0 lb

## 2015-08-03 DIAGNOSIS — N183 Chronic kidney disease, stage 3 unspecified: Secondary | ICD-10-CM

## 2015-08-03 DIAGNOSIS — T18128A Food in esophagus causing other injury, initial encounter: Secondary | ICD-10-CM

## 2015-08-03 DIAGNOSIS — Z23 Encounter for immunization: Secondary | ICD-10-CM | POA: Diagnosis not present

## 2015-08-03 DIAGNOSIS — R7989 Other specified abnormal findings of blood chemistry: Secondary | ICD-10-CM

## 2015-08-03 DIAGNOSIS — Z79899 Other long term (current) drug therapy: Secondary | ICD-10-CM | POA: Diagnosis not present

## 2015-08-03 DIAGNOSIS — K222 Esophageal obstruction: Secondary | ICD-10-CM | POA: Diagnosis not present

## 2015-08-03 DIAGNOSIS — I10 Essential (primary) hypertension: Secondary | ICD-10-CM

## 2015-08-03 NOTE — Progress Notes (Signed)
   Subjective:    Patient ID: Karen Ball, female    DOB: Nov 09, 1917, 82 y.o.   MRN: [id number]  HPI 82 year old female with a history of hypertension and reflux with to the emergency department on February 1 after getting food stuck in her throat while eating at [place]. She underwent EGD and they removed the food. They found a small Mallory-Weiss tear on EGD and some esophagitis likely due to irritation from the impacted bolus. She also developed atrial fibrillation during the procedure while she was anesthetized. She was also noted to have some slight narrowing in the distal esophagus but not considered significant. She was started on Protonix. They have recommended a full liquid/pured Food diet and follow-up as an outpatient. Her Norvasc was switched to Cardizem. And they did not side to anticoagulate her. He was discharged home the following day. Says she took the pantoprazole once and it hurt her esophagus so quit taking it.    He did have some questions today. She noticed that CKD 3 listed on her problem list and wanted to know what that meant.   Review of Systems     Objective:   Physical Exam  Constitutional: She is oriented to person, place, and time. She appears well-developed and well-nourished.  HENT:  Head: Normocephalic and atraumatic.  Cardiovascular: Normal rate, regular rhythm and normal heart sounds.   Pulmonary/Chest: Effort normal and breath sounds normal.  Neurological: She is alert and oriented to person, place, and time.  Skin: Skin is warm and dry.  Psychiatric: She has a normal mood and affect. Her behavior is normal.          Assessment & Plan:  Recent esophageal impaction-we'll need to follow with GI. Continue liquid/pured food diet. Daughter called this morning to get her an appointment as she will need a repeat endoscopy. I encouraged her to retry the pantoprazole one more time. If it causes discomfort or pain after she takes the pill and she will call me  back and we can try switching to Prevacid in which case she can into the capsule and poor it on to her tongue and swallow it.  CKD 3- stable.  Discussed the diagnosis with her.  Abnormal TSH-due to recheck thyroid level. It was elevated at 6.1 with a T4 of 1.3. Next  Chronic hyponatremia-sodium was stable during hospitalization.  Single episode of atrial fibrillation-resolved during hospitalization and she is currently in normal sinus rhythm today.  HTN - her diastolic is really low todays. She says she is hydrating well.  Cut lisinopril in half.  Curvature to hydrate well today.

## 2015-08-03 NOTE — Patient Instructions (Signed)
Take your pantoprazole 30 minutes before breakfast. It is most effective that way.

## 2015-08-04 LAB — T4, FREE: FREE T4: 1.2 ng/dL (ref 0.8–1.8)

## 2015-08-04 LAB — TSH: TSH: 3.45 m[IU]/L

## 2015-08-09 DIAGNOSIS — K222 Esophageal obstruction: Secondary | ICD-10-CM | POA: Diagnosis not present

## 2015-09-02 ENCOUNTER — Telehealth: Payer: Self-pay | Admitting: Family Medicine

## 2015-09-02 NOTE — Telephone Encounter (Signed)
Pt called office today stating she was reading about her Pantoprazole Rx and it can cause edema. Pt states she has suffered from swelling in her legs since before she began this Rx but the edema would go down at night. Now, the edema is constant. Will route to PCP for review and recommendation.

## 2015-09-02 NOTE — Telephone Encounter (Signed)
It is possible but very rare. Less than 2% of people experienced any kind of swelling. We can certainly change it to a different PPI. Area she might do better with Prevacid which she can cut open and poor into her mouth, since she has problems with tabs anyway. Okay to send over Prevacid 30 mg to take once a day if she is okay with switching.

## 2015-09-05 ENCOUNTER — Telehealth: Payer: Self-pay

## 2015-09-05 MED ORDER — LANSOPRAZOLE 30 MG PO CPDR: 30.0000 mg | DELAYED_RELEASE_CAPSULE | Freq: Every day | ORAL | Status: DC

## 2015-09-05 NOTE — Telephone Encounter (Signed)
She can certainly hold off and see if sxs improve. I still think she needs to see GI.

## 2015-09-05 NOTE — Telephone Encounter (Signed)
New Rx sent, daughter notified.

## 2015-09-06 NOTE — Telephone Encounter (Signed)
Spoke with daughter and will follow up with GI if problem continues.  At this point will hold off on medication since symptoms are improving.

## 2015-09-20 ENCOUNTER — Telehealth: Payer: Self-pay

## 2015-09-20 MED ORDER — RALOXIFENE HCL 60 MG PO TABS: 60.0000 mg | ORAL_TABLET | Freq: Every day | ORAL | Status: DC

## 2015-09-20 NOTE — Telephone Encounter (Signed)
Okay to send. Let's just verify her dosing etc. before we sent it.

## 2015-09-20 NOTE — Telephone Encounter (Signed)
Patient states she needs a refill on Cardizem. Historical provider.

## 2015-09-21 MED ORDER — DILTIAZEM HCL ER COATED BEADS 120 MG PO CP24: 120.0000 mg | ORAL_CAPSULE | Freq: Every day | ORAL | Status: DC

## 2015-09-21 NOTE — Telephone Encounter (Signed)
Sent prescription to [facility].  

## 2015-09-26 ENCOUNTER — Other Ambulatory Visit: Payer: Self-pay | Admitting: *Deleted

## 2015-09-26 MED ORDER — DILTIAZEM HCL ER COATED BEADS 120 MG PO CP24: 120.0000 mg | ORAL_CAPSULE | Freq: Every day | ORAL | Status: DC

## 2015-09-30 ENCOUNTER — Ambulatory Visit (INDEPENDENT_AMBULATORY_CARE_PROVIDER_SITE_OTHER): Payer: Commercial Managed Care - HMO | Admitting: Physician Assistant

## 2015-09-30 ENCOUNTER — Encounter: Payer: Self-pay | Admitting: Physician Assistant

## 2015-09-30 VITALS — BP 170/60 | HR 86 | Wt 105.0 lb

## 2015-09-30 DIAGNOSIS — I8312 Varicose veins of left lower extremity with inflammation: Secondary | ICD-10-CM | POA: Diagnosis not present

## 2015-09-30 DIAGNOSIS — E871 Hypo-osmolality and hyponatremia: Secondary | ICD-10-CM

## 2015-09-30 DIAGNOSIS — I872 Venous insufficiency (chronic) (peripheral): Secondary | ICD-10-CM

## 2015-09-30 DIAGNOSIS — R600 Localized edema: Secondary | ICD-10-CM | POA: Diagnosis not present

## 2015-09-30 DIAGNOSIS — I8311 Varicose veins of right lower extremity with inflammation: Secondary | ICD-10-CM

## 2015-09-30 DIAGNOSIS — N183 Chronic kidney disease, stage 3 (moderate): Secondary | ICD-10-CM

## 2015-09-30 DIAGNOSIS — R609 Edema, unspecified: Secondary | ICD-10-CM | POA: Diagnosis not present

## 2015-09-30 LAB — COMPLETE METABOLIC PANEL WITH GFR
ALBUMIN: 3.6 g/dL (ref 3.6–5.1)
ALK PHOS: 62 U/L (ref 33–130)
ALT: 10 U/L (ref 6–29)
AST: 15 U/L (ref 10–35)
BUN: 14 mg/dL (ref 7–25)
CALCIUM: 9.5 mg/dL (ref 8.6–10.4)
CO2: 26 mmol/L (ref 20–31)
Chloride: 94 mmol/L — ABNORMAL LOW (ref 98–110)
Creat: 1.09 mg/dL — ABNORMAL HIGH (ref 0.60–0.88)
GFR, EST NON AFRICAN AMERICAN: 43 mL/min — AB (ref >=60)
GFR, Est African American: 50 mL/min — ABNORMAL LOW (ref >=60)
GLUCOSE: 83 mg/dL (ref 65–99)
POTASSIUM: 4.5 mmol/L (ref 3.5–5.3)
SODIUM: 129 mmol/L — AB (ref 135–146)
Total Bilirubin: 0.5 mg/dL (ref 0.2–1.2)
Total Protein: 6.4 g/dL (ref 6.1–8.1)

## 2015-09-30 MED ORDER — TRIAMCINOLONE 0.1 % CREAM:EUCERIN CREAM 1:1: 1.0000 "application " | TOPICAL_CREAM | Freq: Every day | CUTANEOUS | Status: DC | PRN

## 2015-09-30 NOTE — Patient Instructions (Signed)
Wearing elastic support hose (worn to at least knee height) should be put on in the morning upon arising. Many non-prescription and attractive styles are available for men and women. Although they are initially tight, discomfort is less as the swelling is controlled. Raising the legs above the level of the heart when sitting reduces fluid buildup.  Practicing a proper skin-care routine, which includes using only mild soaps or cleansers and applying a moisturizer (such as petrolatum) after each bath on the legs. Avoiding the use of other creams or topical antibiotics, as allergic reactions are common.

## 2015-10-03 DIAGNOSIS — I872 Venous insufficiency (chronic) (peripheral): Secondary | ICD-10-CM | POA: Insufficient documentation

## 2015-10-03 DIAGNOSIS — R600 Localized edema: Secondary | ICD-10-CM | POA: Insufficient documentation

## 2015-10-03 NOTE — Progress Notes (Signed)
   Subjective:    Patient ID: Karen Ball, female    DOB: 21-Jan-1918, 82 y.o.   MRN: [id number]  HPI Pt is a 82 yo female who presents to the clinic with daughter to discuss ongoing bilateral lower leg edema. She has been seen by Dr. Madilyn Fireman in the past for this about 6 months ago. Pt has had echo which was negative for any acute abnormalities. She has cKD 3. Hx of hypernatronemia and admits to feeling better when she eats salt. She has felt more weak lately. No fever or chills. Ongoing hx of diarrhea/loose stools.    Review of Systems  All other systems reviewed and are negative.      Objective:   Physical Exam  Constitutional: She is oriented to person, place, and time. She appears well-developed and well-nourished.  Cardiovascular: Normal rate, regular rhythm and normal heart sounds.   Pulmonary/Chest: Effort normal and breath sounds normal. She has no wheezes.  Neurological: She is alert and oriented to person, place, and time.  Skin:  1 plus pitting edema bilaterally. Some erythema around ankles with slight scaling.   Psychiatric: She has a normal mood and affect. Her behavior is normal.          Assessment & Plan:  Bilateral lower extremity edema- I do not think cardiac. Mild edema today. I do not think she would tolerate diuretic. No signs of DVT. I do think venous statis is likely due to most of swelling. Discussed compression stockings. Discussed walking around the house every 1 hour or so to help with circulating. For erythema and scaling from edema sent euceri/triamcinolone bid as needed. Will recheck CMP due to CKD and possible low albumin causing some swelling. Encouraged pt to continue to eat and drink and supplement with Boost/Ensure. Encouraged elevation of feet while sitting. Follow up as needed.

## 2015-10-24 ENCOUNTER — Telehealth: Payer: Self-pay | Admitting: Family Medicine

## 2015-10-24 DIAGNOSIS — E871 Hypo-osmolality and hyponatremia: Secondary | ICD-10-CM | POA: Diagnosis not present

## 2015-10-24 NOTE — Telephone Encounter (Signed)
Order placed and faxed.Karen Ball  

## 2015-10-24 NOTE — Telephone Encounter (Signed)
Daughter called. She was told that she can call to have lab order down sent down for her Mom.  She will be bringing her Mom in for lab work late Wednesday morning.

## 2015-10-24 NOTE — Addendum Note (Signed)
Addended by: Teddy Spike on: 10/24/2015 05:31 PM   Modules accepted: Orders

## 2015-10-26 LAB — BASIC METABOLIC PANEL
BUN: 19 mg/dL (ref 7–25)
CHLORIDE: 94 mmol/L — AB (ref 98–110)
CO2: 25 mmol/L (ref 20–31)
Calcium: 9.2 mg/dL (ref 8.6–10.4)
Creat: 1.16 mg/dL — ABNORMAL HIGH (ref 0.60–0.88)
GLUCOSE: 132 mg/dL — AB (ref 65–99)
POTASSIUM: 4.4 mmol/L (ref 3.5–5.3)
SODIUM: 126 mmol/L — AB (ref 135–146)

## 2015-10-27 ENCOUNTER — Telehealth: Payer: Self-pay | Admitting: *Deleted

## 2015-10-27 DIAGNOSIS — E871 Hypo-osmolality and hyponatremia: Secondary | ICD-10-CM

## 2015-10-27 NOTE — Telephone Encounter (Signed)
Referral placed for endocrinology

## 2015-10-28 ENCOUNTER — Telehealth: Payer: Self-pay | Admitting: *Deleted

## 2015-10-28 DIAGNOSIS — E871 Hypo-osmolality and hyponatremia: Secondary | ICD-10-CM

## 2015-10-28 NOTE — Telephone Encounter (Signed)
Pt called and stated that she has some sodium tablets that are 394mg  and would like to know how to take them.   Pt told to take 1 tab BID with meals and return in 1 wk to have labs checked. Lab ordered and faxed.Marland KitchenMarland KitchenAudelia Hives [place]

## 2015-11-07 ENCOUNTER — Ambulatory Visit (INDEPENDENT_AMBULATORY_CARE_PROVIDER_SITE_OTHER): Payer: Commercial Managed Care - HMO | Admitting: Family Medicine

## 2015-11-07 ENCOUNTER — Encounter: Payer: Self-pay | Admitting: Family Medicine

## 2015-11-07 VITALS — BP 138/62 | HR 73 | Wt 107.0 lb

## 2015-11-07 DIAGNOSIS — G4700 Insomnia, unspecified: Secondary | ICD-10-CM

## 2015-11-07 DIAGNOSIS — N183 Chronic kidney disease, stage 3 unspecified: Secondary | ICD-10-CM

## 2015-11-07 DIAGNOSIS — E871 Hypo-osmolality and hyponatremia: Secondary | ICD-10-CM

## 2015-11-07 DIAGNOSIS — I10 Essential (primary) hypertension: Secondary | ICD-10-CM

## 2015-11-07 NOTE — Progress Notes (Signed)
Subjective:    Patient ID: Karen Ball, female    DOB: 1917/10/18, 82 y.o.   MRN: [id number]  HPI Hypertension- Pt denies chest pain, SOB, dizziness, or heart palpitations.  Taking meds as directed w/o problems.  Denies medication side effects.    CKD 3 - She still has incontinence issues but no recent changes in urination or pattern. Last creatinine was 1.16 on May 1.  Hyponatremia, chronic - He did start some over-the-counter sodium tabs. I would like to recheck her level today. She does have an appointment next week with endocrinology.  Note she did experience some sharp pains on the right side of her head this morning. They just lasted for a few minutes but they seem to have resolved on their own.  Insomnia-she still uses a quarter of the Xanax tab occasionally as needed for sleep.   Review of Systems  BP 155/72 mmHg  Pulse 73  Wt 107 lb (48.535 kg)  SpO2 100%    Allergies  Allergen Reactions  . Bactrim [Sulfamethoxazole-Trimethoprim]     Nausea/hyponatremia  . Iodinated Diagnostic Agents   . Pollen Extract     Past Medical History  Diagnosis Date  . Hearing loss     bilateral hearing aids  . Hypertension   . IBS (irritable bowel syndrome)     Past Surgical History  Procedure Laterality Date  . Abdominal hysterectomy    . Cholecystectomy    . Tonsillectomy    . Cataracts      bilateral    Social History   Social History  . Marital Status: Widowed    Spouse Name: N/A  . Number of Children: N/A  . Years of Education: N/A   Occupational History  . Not on file.   Social History Main Topics  . Smoking status: Never Smoker   . Smokeless tobacco: Not on file  . Alcohol Use: No  . Drug Use: No  . Sexual Activity: Not on file     Comment: lives alone, 2 children, retired.   Other Topics Concern  . Not on file   Social History Narrative    Family History  Problem Relation Age of Onset  . Stroke Mother   . Heart attack Father   . GI problems  Father   . Heart attack Brother     Outpatient Encounter Prescriptions as of 11/07/2015  Medication Sig  . ALPRAZolam (XANAX) 0.5 MG tablet TAKE 1-1/2 TABLETS BY MOUTH EVERY DAY AS NEEDED FOR ANXIETY  . AMBULATORY NON FORMULARY MEDICATION Medication Name: Sodium tablets  . aspirin 81 MG tablet Take 81 mg by mouth daily.    . betamethasone dipropionate (DIPROLENE) 0.05 % cream Apply topically 2 (two) times daily.  Marland Kitchen diltiazem (CARDIZEM CD) 120 MG 24 hr capsule Take 1 capsule (120 mg total) by mouth daily.  . diphenoxylate-atropine (LOMOTIL) 2.5-0.025 MG per tablet TAKE 1 TABLET BY MOUTH FOUR TIMES DAILY AS NEEDED FOR DIARRHIA OR LOOSE STOOLS  . ESTRACE VAGINAL 0.1 MG/GM vaginal cream   . lansoprazole (PREVACID) 30 MG capsule Take 1 capsule (30 mg total) by mouth daily at 12 noon.  Marland Kitchen losartan (COZAAR) 100 MG tablet Take 1 tablet (100 mg total) by mouth daily.  . ondansetron (ZOFRAN-ODT) 4 MG disintegrating tablet Take one tablet Every 4-8 hours as needed for nausea.  . OXYTROL 3.9 MG/24HR APPLY 1 PATCH 1 TO 2 TIMES A WEEK AS DIRECTED  . raloxifene (EVISTA) 60 MG tablet Take 1 tablet (60 mg  total) by mouth daily.  . Triamcinolone Acetonide (TRIAMCINOLONE 0.1 % CREAM : EUCERIN) CREA Apply 1 application topically daily as needed for rash. 1 to 1 ratio.   No facility-administered encounter medications on file as of 11/07/2015.        Objective:   Physical Exam  Constitutional: She is oriented to person, place, and time. She appears well-developed and well-nourished.  HENT:  Head: Normocephalic and atraumatic.  Cardiovascular: Normal rate, regular rhythm and normal heart sounds.   Pulmonary/Chest: Effort normal and breath sounds normal.  Neurological: She is alert and oriented to person, place, and time.  Skin: Skin is warm and dry.  Psychiatric: She has a normal mood and affect. Her behavior is normal.        Assessment & Plan:  HTN - Well controlled. Continue current regimen. Follow  up in 6 mo.   Chronic hyponatremia-her sodium had drifted even lower. Now on salt tabs and have referred her to endocrinology for further evaluation.  CKD 3 - Stable over the last 9 months.    Insomnia - Ok to continue prn xanax. Typically quarters the day.

## 2015-11-08 LAB — BASIC METABOLIC PANEL WITH GFR
BUN: 18 mg/dL (ref 7–25)
CHLORIDE: 99 mmol/L (ref 98–110)
CO2: 26 mmol/L (ref 20–31)
CREATININE: 1.34 mg/dL — AB (ref 0.60–0.88)
Calcium: 9.3 mg/dL (ref 8.6–10.4)
GFR, Est African American: 39 mL/min — ABNORMAL LOW (ref >=60)
GFR, Est Non African American: 34 mL/min — ABNORMAL LOW (ref >=60)
Glucose, Bld: 118 mg/dL — ABNORMAL HIGH (ref 65–99)
POTASSIUM: 4.3 mmol/L (ref 3.5–5.3)
Sodium: 135 mmol/L (ref 135–146)

## 2015-11-16 DIAGNOSIS — N183 Chronic kidney disease, stage 3 (moderate): Secondary | ICD-10-CM | POA: Diagnosis not present

## 2015-11-16 DIAGNOSIS — I10 Essential (primary) hypertension: Secondary | ICD-10-CM | POA: Diagnosis not present

## 2015-11-16 DIAGNOSIS — E871 Hypo-osmolality and hyponatremia: Secondary | ICD-10-CM | POA: Diagnosis not present

## 2015-11-24 DIAGNOSIS — E871 Hypo-osmolality and hyponatremia: Secondary | ICD-10-CM | POA: Diagnosis not present

## 2016-01-02 DIAGNOSIS — E871 Hypo-osmolality and hyponatremia: Secondary | ICD-10-CM | POA: Diagnosis not present

## 2016-01-05 DIAGNOSIS — I10 Essential (primary) hypertension: Secondary | ICD-10-CM | POA: Diagnosis not present

## 2016-01-05 DIAGNOSIS — E222 Syndrome of inappropriate secretion of antidiuretic hormone: Secondary | ICD-10-CM | POA: Diagnosis not present

## 2016-01-05 DIAGNOSIS — N183 Chronic kidney disease, stage 3 (moderate): Secondary | ICD-10-CM | POA: Diagnosis not present

## 2016-01-13 ENCOUNTER — Other Ambulatory Visit: Payer: Self-pay | Admitting: Family Medicine

## 2016-02-07 ENCOUNTER — Ambulatory Visit (INDEPENDENT_AMBULATORY_CARE_PROVIDER_SITE_OTHER): Payer: Commercial Managed Care - HMO | Admitting: Family Medicine

## 2016-02-07 ENCOUNTER — Encounter: Payer: Self-pay | Admitting: Family Medicine

## 2016-02-07 VITALS — BP 140/60 | HR 70 | Resp 16 | Wt 107.0 lb

## 2016-02-07 DIAGNOSIS — I10 Essential (primary) hypertension: Secondary | ICD-10-CM

## 2016-02-07 DIAGNOSIS — G4700 Insomnia, unspecified: Secondary | ICD-10-CM

## 2016-02-07 DIAGNOSIS — N183 Chronic kidney disease, stage 3 unspecified: Secondary | ICD-10-CM

## 2016-02-07 DIAGNOSIS — R7301 Impaired fasting glucose: Secondary | ICD-10-CM

## 2016-02-07 LAB — POCT GLYCOSYLATED HEMOGLOBIN (HGB A1C): HEMOGLOBIN A1C: 6.7

## 2016-02-07 NOTE — Progress Notes (Signed)
Subjective:    CC: HTN  HPI:  Hypertension- Pt denies chest pain, SOB, dizziness, or heart palpitations.  Taking meds as directed w/o problems.  Denies medication side effects.    IFG - No inc thrist or urination  .  CKD 3  - Last creatinine was 1.3 on May 15. Blood work is up-to-date. No change in renal function.  Insomnia-she uses an occasional half a tab of alprazolam maybe once a week mostly for sleep issues.  Past medical history, Surgical history, Family history not pertinant except as noted below, Social history, Allergies, and medications have been entered into the medical record, reviewed, and corrections made.   Review of Systems: No fevers, chills, night sweats, weight loss, chest pain, or shortness of breath.   Objective:    General: Well Developed, well nourished, and in no acute distress.  Neuro: Alert and oriented x3, extra-ocular muscles intact, sensation grossly intact.  HEENT: Normocephalic, atraumatic  Skin: Warm and dry, no rashes. Cardiac: Regular rate and rhythm, no murmurs rubs or gallops, no lower extremity edema.  Respiratory: Clear to auscultation bilaterally. Not using accessory muscles, speaking in full sentences.   Impression and Recommendations:    HTN- Well controlled for age Continue current regimen. Follow up in  6 month   IFG -  Unfortunately her A1c was elevated today at 6.7. Dissection technically puts her into the diabetic range. We are going to work on dietary choices. She admits she's been eating a fair amount of sweets recently. She also drinks rate sometimes to help with her sodium levels. I really like her to work on this for the next 3 months and see if we can get her A1c down. Her A1c a year and a half ago was 5.0. Follow-up 3 months.  Insomnia-she's using the alprazolam very sparingly. Did warn her of potential side effects including increased risk of falls and Alzheimer's dementia.  CKD- 3 stable. Follow every 6 mo.

## 2016-02-10 ENCOUNTER — Ambulatory Visit: Payer: Commercial Managed Care - HMO | Admitting: Physician Assistant

## 2016-03-09 ENCOUNTER — Ambulatory Visit: Payer: Commercial Managed Care - HMO | Admitting: Sports Medicine

## 2016-03-23 ENCOUNTER — Other Ambulatory Visit: Payer: Self-pay | Admitting: Family Medicine

## 2016-04-05 IMAGING — CR DG CHEST 2V
2 series · 2 of 2 positions shown · non-contrast
Comparison: 09/20/2005.

CLINICAL DATA: [AGE] female with cough nausea and vomiting.
Initial encounter.

EXAM:
CHEST  2 VIEW

[view not recorded (1 of 2)]
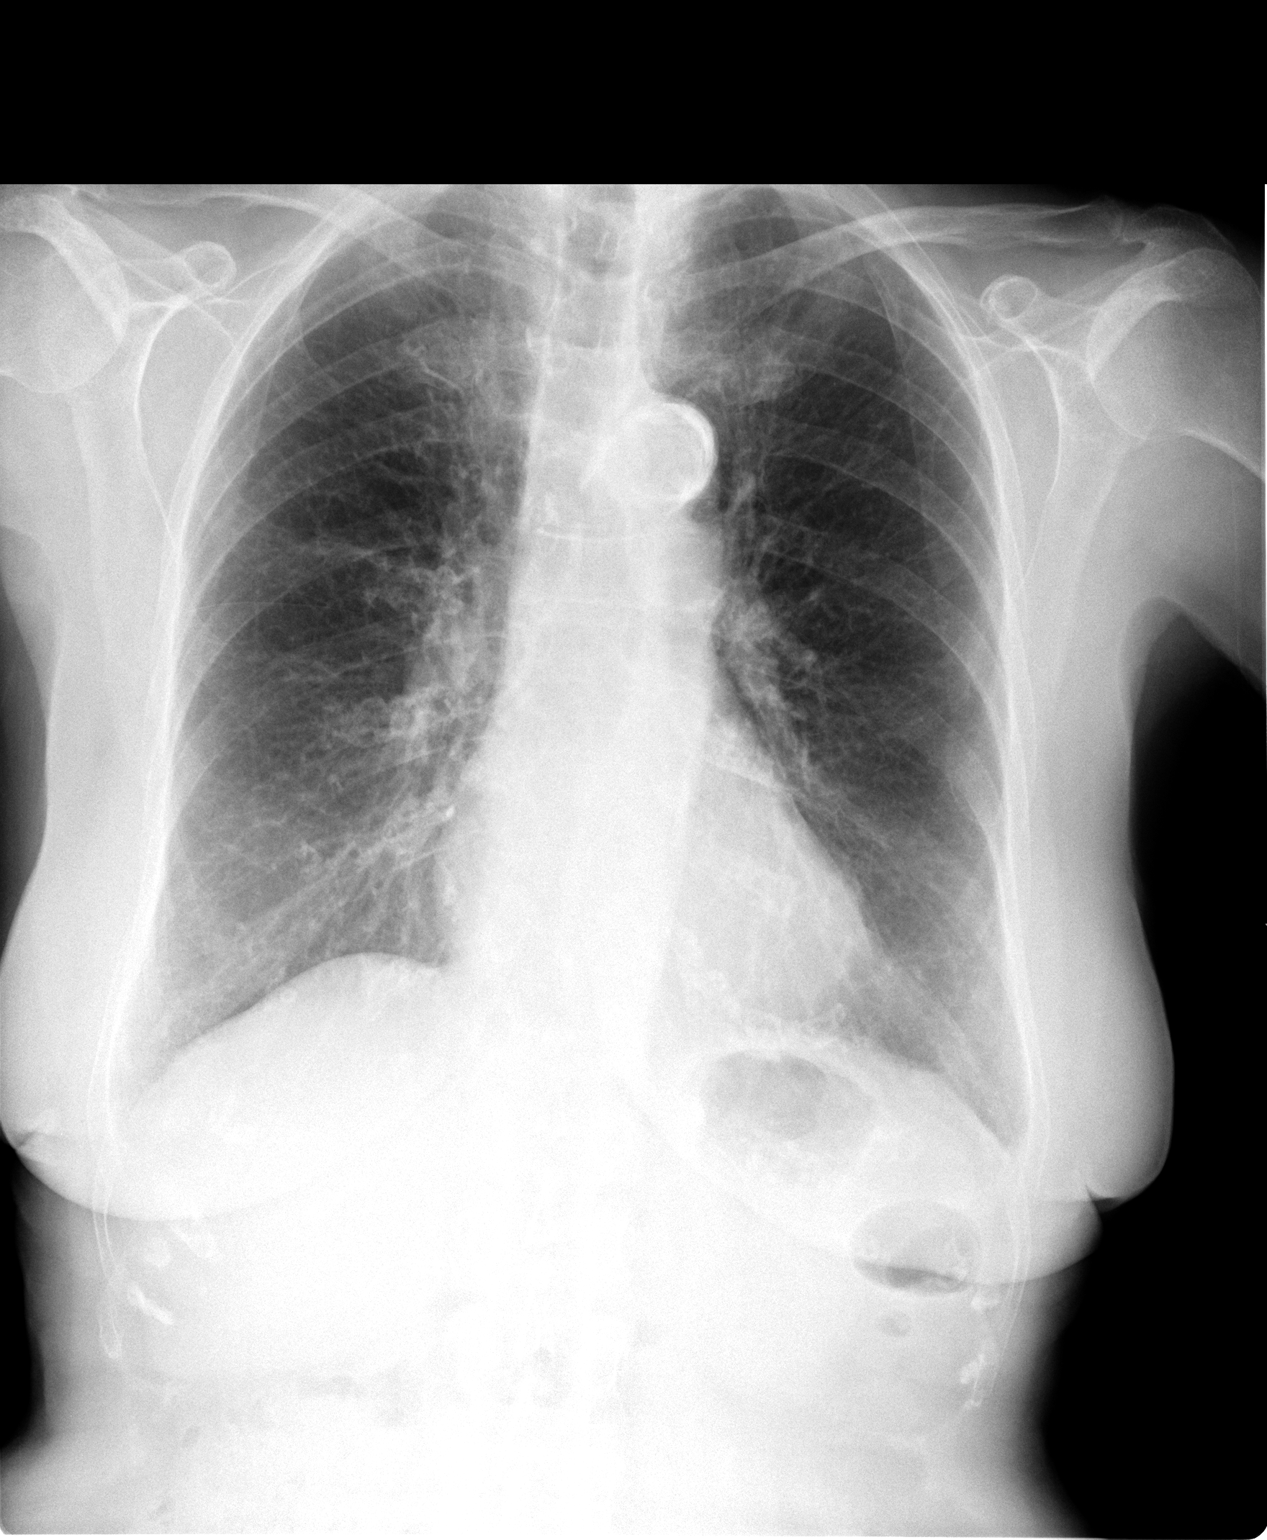

[view not recorded (2 of 2)]
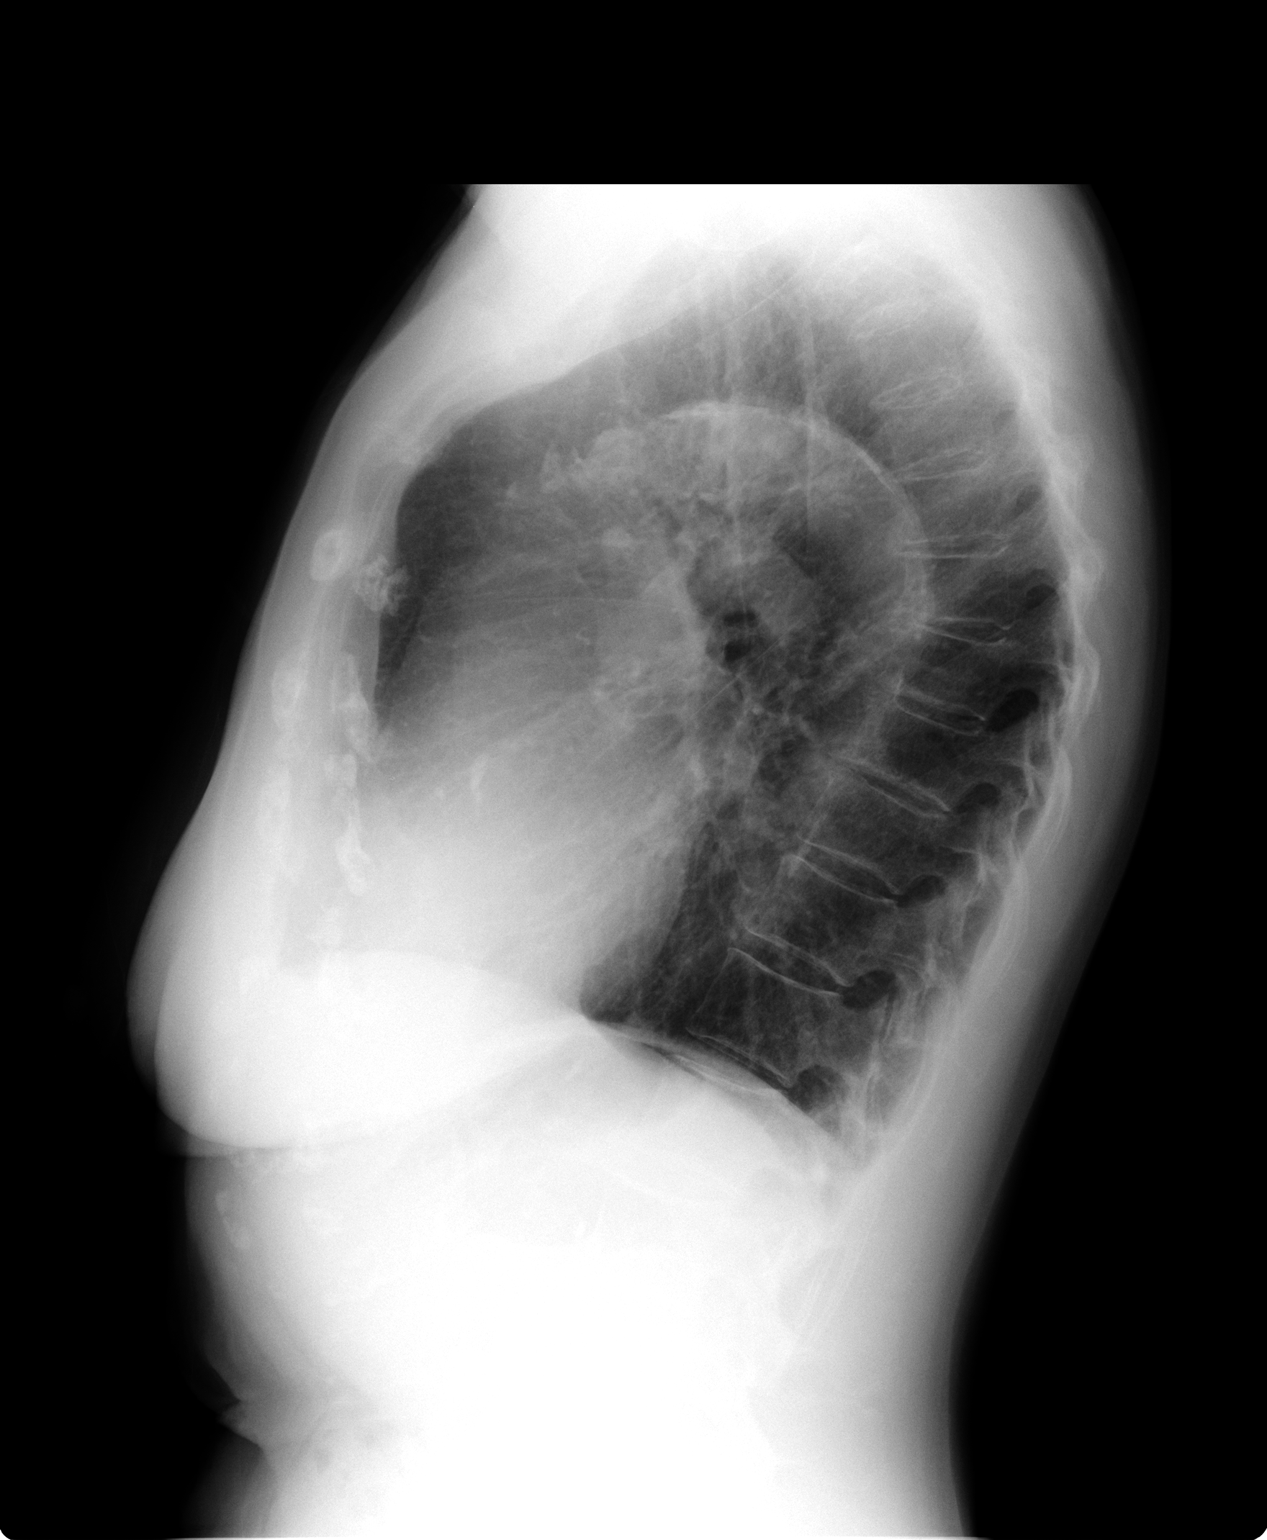

[2 of 2 positions shown; findings below may reference images not displayed]

FINDINGS: Stable lung volumes. Stable cardiac size and mediastinal contours.
Calcified atherosclerosis of the aorta. Chronic increased
interstitial markings, perhaps mildly progressed since 7665. No
pneumothorax, pulmonary edema, pleural effusion or confluent
pulmonary opacity. No acute osseous abnormality identified. Right
upper quadrant surgical clips again noted. Visualized tracheal air
column is within normal limits. No pneumoperitoneum identified.
IMPRESSION: No acute cardiopulmonary abnormality.

## 2016-05-10 ENCOUNTER — Ambulatory Visit: Payer: Commercial Managed Care - HMO | Admitting: Family Medicine

## 2016-05-11 ENCOUNTER — Ambulatory Visit (INDEPENDENT_AMBULATORY_CARE_PROVIDER_SITE_OTHER): Payer: Commercial Managed Care - HMO | Admitting: Family Medicine

## 2016-05-11 ENCOUNTER — Encounter: Payer: Self-pay | Admitting: Family Medicine

## 2016-05-11 VITALS — BP 146/74 | HR 72 | Wt 105.0 lb

## 2016-05-11 DIAGNOSIS — R350 Frequency of micturition: Secondary | ICD-10-CM

## 2016-05-11 DIAGNOSIS — R7301 Impaired fasting glucose: Secondary | ICD-10-CM | POA: Diagnosis not present

## 2016-05-11 DIAGNOSIS — R300 Dysuria: Secondary | ICD-10-CM | POA: Diagnosis not present

## 2016-05-11 DIAGNOSIS — I10 Essential (primary) hypertension: Secondary | ICD-10-CM | POA: Diagnosis not present

## 2016-05-11 LAB — POCT URINALYSIS DIPSTICK
BILIRUBIN UA: NEGATIVE
GLUCOSE UA: NEGATIVE
KETONES UA: NEGATIVE
Nitrite, UA: POSITIVE
PROTEIN UA: NEGATIVE
Urobilinogen, UA: 0.2
pH, UA: 6.5

## 2016-05-11 LAB — POCT GLYCOSYLATED HEMOGLOBIN (HGB A1C): Hemoglobin A1C: 6.1

## 2016-05-11 MED ORDER — CIPROFLOXACIN HCL 500 MG PO TABS: 500.0000 mg | ORAL_TABLET | Freq: Two times a day (BID) | ORAL | 0 refills | Status: AC

## 2016-05-11 NOTE — Progress Notes (Signed)
Subjective:    CC: IFG   HPI:  IFG - last a1c was elevated so had her come back in today. She really made some major changes to her diet. She started eating a less sugary cereal, switch to diet cranberry juice, and really A lot of cookies and sweets that she was eating. She exercises 3-4 days per week for about half an hour and also rides her stationary bike.  Hypertension- Pt denies chest pain, SOB, dizziness, or heart palpitations.  Taking meds as directed w/o problems.  Denies medication side effects.    Dyuria started last night. She also had frequency. She said by 4 AM this morning she artery gotten up 3 times to urinate. No fevers chills or sweats or significant back pain. Took an ASA and that did seem to help.    Past medical history, Surgical history, Family history not pertinant except as noted below, Social history, Allergies, and medications have been entered into the medical record, reviewed, and corrections made.   Review of Systems: No fevers, chills, night sweats, weight loss, chest pain, or shortness of breath.   Objective:    General: Well Developed, well nourished, and in no acute distress.  Neuro: Alert and oriented x3, extra-ocular muscles intact, sensation grossly intact.  HEENT: Normocephalic, atraumatic  Skin: Warm and dry, no rashes. Cardiac: Regular rate and rhythm, no murmurs rubs or gallops, no lower extremity edema.  Respiratory: Clear to auscultation bilaterally. Not using accessory muscles, speaking in full sentences.   Impression and Recommendations:    IFG - Much improved. Her A1c is back down to 6.1 which is absolutely fantastic. Encouraged her to keep up her good habits. Follow-up in 6 months.  HTN - Well controlled. Continue current regimen. Follow up in  4 months   Urinary frequency - She was unable to give a urine sample today. Sent home with a cup to provide a sample and drop off at her convenience. She is feeling a little bit better today.

## 2016-05-11 NOTE — Addendum Note (Signed)
Addended by: Teddy Spike on: 05/11/2016 05:00 PM   Modules accepted: Orders

## 2016-05-11 NOTE — Addendum Note (Signed)
Addended by: Beatrice Lecher D on: 05/11/2016 05:31 PM   Modules accepted: Orders

## 2016-05-14 LAB — URINE CULTURE

## 2016-06-04 DIAGNOSIS — K5900 Constipation, unspecified: Secondary | ICD-10-CM | POA: Diagnosis not present

## 2016-06-04 DIAGNOSIS — K222 Esophageal obstruction: Secondary | ICD-10-CM | POA: Diagnosis not present

## 2016-06-04 DIAGNOSIS — R1013 Epigastric pain: Secondary | ICD-10-CM | POA: Diagnosis not present

## 2016-06-04 DIAGNOSIS — T18128A Food in esophagus causing other injury, initial encounter: Secondary | ICD-10-CM | POA: Diagnosis not present

## 2016-06-04 DIAGNOSIS — E222 Syndrome of inappropriate secretion of antidiuretic hormone: Secondary | ICD-10-CM | POA: Diagnosis not present

## 2016-06-04 DIAGNOSIS — R079 Chest pain, unspecified: Secondary | ICD-10-CM | POA: Diagnosis not present

## 2016-06-04 DIAGNOSIS — F419 Anxiety disorder, unspecified: Secondary | ICD-10-CM | POA: Diagnosis not present

## 2016-06-04 DIAGNOSIS — K589 Irritable bowel syndrome without diarrhea: Secondary | ICD-10-CM | POA: Diagnosis not present

## 2016-06-04 DIAGNOSIS — R1310 Dysphagia, unspecified: Secondary | ICD-10-CM | POA: Diagnosis not present

## 2016-06-04 DIAGNOSIS — Z66 Do not resuscitate: Secondary | ICD-10-CM | POA: Diagnosis not present

## 2016-06-04 DIAGNOSIS — I129 Hypertensive chronic kidney disease with stage 1 through stage 4 chronic kidney disease, or unspecified chronic kidney disease: Secondary | ICD-10-CM | POA: Diagnosis not present

## 2016-06-04 DIAGNOSIS — I10 Essential (primary) hypertension: Secondary | ICD-10-CM | POA: Diagnosis not present

## 2016-06-04 DIAGNOSIS — H8110 Benign paroxysmal vertigo, unspecified ear: Secondary | ICD-10-CM | POA: Diagnosis not present

## 2016-06-04 DIAGNOSIS — K228 Other specified diseases of esophagus: Secondary | ICD-10-CM | POA: Diagnosis not present

## 2016-06-04 DIAGNOSIS — R630 Anorexia: Secondary | ICD-10-CM | POA: Diagnosis not present

## 2016-06-04 DIAGNOSIS — I4891 Unspecified atrial fibrillation: Secondary | ICD-10-CM | POA: Diagnosis not present

## 2016-06-04 DIAGNOSIS — N183 Chronic kidney disease, stage 3 (moderate): Secondary | ICD-10-CM | POA: Diagnosis not present

## 2016-06-04 DIAGNOSIS — I700 Atherosclerosis of aorta: Secondary | ICD-10-CM | POA: Diagnosis not present

## 2016-06-05 DIAGNOSIS — K222 Esophageal obstruction: Secondary | ICD-10-CM | POA: Diagnosis not present

## 2016-06-05 DIAGNOSIS — K228 Other specified diseases of esophagus: Secondary | ICD-10-CM | POA: Diagnosis not present

## 2016-06-05 DIAGNOSIS — N183 Chronic kidney disease, stage 3 (moderate): Secondary | ICD-10-CM | POA: Diagnosis not present

## 2016-06-05 DIAGNOSIS — R1310 Dysphagia, unspecified: Secondary | ICD-10-CM | POA: Diagnosis not present

## 2016-06-05 DIAGNOSIS — I10 Essential (primary) hypertension: Secondary | ICD-10-CM | POA: Diagnosis not present

## 2016-06-05 DIAGNOSIS — K219 Gastro-esophageal reflux disease without esophagitis: Secondary | ICD-10-CM | POA: Diagnosis not present

## 2016-06-05 DIAGNOSIS — T18128A Food in esophagus causing other injury, initial encounter: Secondary | ICD-10-CM | POA: Diagnosis not present

## 2016-06-06 DIAGNOSIS — K219 Gastro-esophageal reflux disease without esophagitis: Secondary | ICD-10-CM | POA: Diagnosis not present

## 2016-06-06 DIAGNOSIS — R1310 Dysphagia, unspecified: Secondary | ICD-10-CM | POA: Diagnosis not present

## 2016-06-06 DIAGNOSIS — K222 Esophageal obstruction: Secondary | ICD-10-CM | POA: Diagnosis not present

## 2016-06-06 DIAGNOSIS — T18128A Food in esophagus causing other injury, initial encounter: Secondary | ICD-10-CM | POA: Diagnosis not present

## 2016-06-06 DIAGNOSIS — N183 Chronic kidney disease, stage 3 (moderate): Secondary | ICD-10-CM | POA: Diagnosis not present

## 2016-06-06 DIAGNOSIS — I10 Essential (primary) hypertension: Secondary | ICD-10-CM | POA: Diagnosis not present

## 2016-06-06 DIAGNOSIS — K228 Other specified diseases of esophagus: Secondary | ICD-10-CM | POA: Diagnosis not present

## 2016-07-04 DIAGNOSIS — Z961 Presence of intraocular lens: Secondary | ICD-10-CM | POA: Diagnosis not present

## 2016-07-04 DIAGNOSIS — H04123 Dry eye syndrome of bilateral lacrimal glands: Secondary | ICD-10-CM | POA: Diagnosis not present

## 2016-07-04 DIAGNOSIS — Z9841 Cataract extraction status, right eye: Secondary | ICD-10-CM | POA: Diagnosis not present

## 2016-07-04 DIAGNOSIS — H26491 Other secondary cataract, right eye: Secondary | ICD-10-CM | POA: Diagnosis not present

## 2016-07-04 DIAGNOSIS — H35443 Age-related reticular degeneration of retina, bilateral: Secondary | ICD-10-CM | POA: Diagnosis not present

## 2016-07-04 DIAGNOSIS — H52223 Regular astigmatism, bilateral: Secondary | ICD-10-CM | POA: Diagnosis not present

## 2016-07-04 DIAGNOSIS — H5203 Hypermetropia, bilateral: Secondary | ICD-10-CM | POA: Diagnosis not present

## 2016-07-04 DIAGNOSIS — H524 Presbyopia: Secondary | ICD-10-CM | POA: Diagnosis not present

## 2016-07-04 DIAGNOSIS — H43393 Other vitreous opacities, bilateral: Secondary | ICD-10-CM | POA: Diagnosis not present

## 2016-09-07 ENCOUNTER — Ambulatory Visit: Payer: Commercial Managed Care - HMO | Admitting: Family Medicine

## 2016-09-07 ENCOUNTER — Ambulatory Visit: Payer: Commercial Managed Care - HMO

## 2016-09-13 ENCOUNTER — Ambulatory Visit (INDEPENDENT_AMBULATORY_CARE_PROVIDER_SITE_OTHER): Payer: Medicare HMO | Admitting: Family Medicine

## 2016-09-13 ENCOUNTER — Encounter: Payer: Self-pay | Admitting: Family Medicine

## 2016-09-13 ENCOUNTER — Ambulatory Visit: Payer: Commercial Managed Care - HMO

## 2016-09-13 VITALS — BP 140/68 | HR 70 | Ht 60.0 in | Wt 103.0 lb

## 2016-09-13 DIAGNOSIS — F5104 Psychophysiologic insomnia: Secondary | ICD-10-CM | POA: Diagnosis not present

## 2016-09-13 DIAGNOSIS — N183 Chronic kidney disease, stage 3 unspecified: Secondary | ICD-10-CM

## 2016-09-13 DIAGNOSIS — R7301 Impaired fasting glucose: Secondary | ICD-10-CM | POA: Diagnosis not present

## 2016-09-13 DIAGNOSIS — I10 Essential (primary) hypertension: Secondary | ICD-10-CM | POA: Diagnosis not present

## 2016-09-13 LAB — COMPLETE METABOLIC PANEL WITH GFR
ALK PHOS: 69 U/L (ref 33–130)
ALT: 10 U/L (ref 6–29)
AST: 16 U/L (ref 10–35)
Albumin: 3.6 g/dL (ref 3.6–5.1)
BUN: 18 mg/dL (ref 7–25)
CO2: 27 mmol/L (ref 20–31)
Calcium: 9.4 mg/dL (ref 8.6–10.4)
Chloride: 103 mmol/L (ref 98–110)
Creat: 1.17 mg/dL — ABNORMAL HIGH (ref 0.60–0.88)
GFR, EST AFRICAN AMERICAN: 45 mL/min — AB (ref >=60)
GFR, EST NON AFRICAN AMERICAN: 39 mL/min — AB (ref >=60)
GLUCOSE: 87 mg/dL (ref 65–99)
POTASSIUM: 4.3 mmol/L (ref 3.5–5.3)
Sodium: 140 mmol/L (ref 135–146)
Total Bilirubin: 0.4 mg/dL (ref 0.2–1.2)
Total Protein: 6.4 g/dL (ref 6.1–8.1)

## 2016-09-13 LAB — POCT GLYCOSYLATED HEMOGLOBIN (HGB A1C): HEMOGLOBIN A1C: 5.9

## 2016-09-13 MED ORDER — ALPRAZOLAM 0.5 MG PO TABS: ORAL_TABLET | ORAL | 0 refills | Status: DC

## 2016-09-13 NOTE — Progress Notes (Signed)
Subjective:    CC: HTN  HPI:  Brought in by her granddaughter today.  Hypertension- Pt denies chest pain, SOB, dizziness, or heart palpitations.  Taking meds as directed w/o problems.  Denies medication side effects.    Impaired fasting glucose-no increased thirst or urination. No symptoms consistent with hypoglycemia.  CKD- NO change in urination.  Insomnia-she's requesting a refill on her alprazolam. She just takes a half a tab once a week on Saturday nights because she can never go to sleep well the night before she goes to church because she has to get up early to get to [place].  Past medical history, Surgical history, Family history not pertinant except as noted below, Social history, Allergies, and medications have been entered into the medical record, reviewed, and corrections made.   Review of Systems: No fevers, chills, night sweats, weight loss, chest pain, or shortness of breath.   Objective:    Vitals:   09/13/16 1041 09/13/16 1103  BP: (!) 161/50 140/68  Pulse: 70      Vitals:   09/13/16 1041  Weight: 103 lb (46.7 kg)  Height: 5' (1.524 m)    General: Well Developed, well nourished, and in no acute distress.  Neuro: Alert and oriented x3, extra-ocular muscles intact, sensation grossly intact.  HEENT: Normocephalic, atraumatic  Skin: Warm and dry, no rashes. Cardiac: Regular rate and rhythm, no murmurs rubs or gallops, no lower extremity edema.  Respiratory: Clear to auscultation bilaterally. Not using accessory muscles, speaking in full sentences.   Impression and Recommendations:    HTN - Well controlled. Continue current regimen. Follow up in  6 montns. Due BMP.    IFG - Well controlled. Hemoglobin A1c down to 5.9 which is absolutely fantastic.  CKD- 2 to recheck renal function. Lab slip provided today.  Insomnia-okay to refill alprazolam today.Continue to use sparingly. She actually will be going to church later after Easter as they will be moving  the surface time. She is actually hoping that that will help with her sleep issue.

## 2016-09-14 ENCOUNTER — Ambulatory Visit: Payer: Commercial Managed Care - HMO | Admitting: Family Medicine

## 2016-09-14 ENCOUNTER — Ambulatory Visit: Payer: Commercial Managed Care - HMO

## 2016-09-14 NOTE — Progress Notes (Signed)
All labs are normal. 

## 2016-09-29 ENCOUNTER — Other Ambulatory Visit: Payer: Self-pay | Admitting: Family Medicine

## 2016-12-17 ENCOUNTER — Other Ambulatory Visit: Payer: Self-pay | Admitting: Family Medicine

## 2016-12-18 ENCOUNTER — Other Ambulatory Visit: Payer: Self-pay

## 2016-12-18 MED ORDER — LOSARTAN POTASSIUM 100 MG PO TABS: 100.0000 mg | ORAL_TABLET | Freq: Every day | ORAL | 3 refills | Status: DC

## 2017-02-19 ENCOUNTER — Other Ambulatory Visit: Payer: Self-pay | Admitting: Family Medicine

## 2017-02-28 ENCOUNTER — Other Ambulatory Visit: Payer: Self-pay | Admitting: *Deleted

## 2017-02-28 MED ORDER — DILTIAZEM HCL ER COATED BEADS 120 MG PO CP24: 120.0000 mg | ORAL_CAPSULE | Freq: Every day | ORAL | 2 refills | Status: DC

## 2017-03-07 ENCOUNTER — Other Ambulatory Visit: Payer: Self-pay | Admitting: Family Medicine

## 2017-03-18 ENCOUNTER — Ambulatory Visit (INDEPENDENT_AMBULATORY_CARE_PROVIDER_SITE_OTHER): Payer: Medicare HMO | Admitting: Family Medicine

## 2017-03-18 ENCOUNTER — Encounter: Payer: Self-pay | Admitting: Family Medicine

## 2017-03-18 VITALS — BP 142/60 | HR 75 | Ht 60.0 in | Wt 107.0 lb

## 2017-03-18 DIAGNOSIS — Z23 Encounter for immunization: Secondary | ICD-10-CM | POA: Diagnosis not present

## 2017-03-18 DIAGNOSIS — I10 Essential (primary) hypertension: Secondary | ICD-10-CM | POA: Diagnosis not present

## 2017-03-18 DIAGNOSIS — R7301 Impaired fasting glucose: Secondary | ICD-10-CM

## 2017-03-18 DIAGNOSIS — N183 Chronic kidney disease, stage 3 unspecified: Secondary | ICD-10-CM

## 2017-03-18 DIAGNOSIS — M659 Synovitis and tenosynovitis, unspecified: Secondary | ICD-10-CM | POA: Diagnosis not present

## 2017-03-18 LAB — COMPLETE METABOLIC PANEL WITH GFR
AG RATIO: 1.3 (calc) (ref 1.0–2.5)
ALBUMIN MSPROF: 3.6 g/dL (ref 3.6–5.1)
ALKALINE PHOSPHATASE (APISO): 71 U/L (ref 33–130)
ALT: 10 U/L (ref 6–29)
AST: 15 U/L (ref 10–35)
BILIRUBIN TOTAL: 0.6 mg/dL (ref 0.2–1.2)
BUN / CREAT RATIO: 13 (calc) (ref 6–22)
BUN: 16 mg/dL (ref 7–25)
CHLORIDE: 101 mmol/L (ref 98–110)
CO2: 30 mmol/L (ref 20–32)
Calcium: 9.7 mg/dL (ref 8.6–10.4)
Creat: 1.28 mg/dL — ABNORMAL HIGH (ref 0.60–0.88)
GFR, EST AFRICAN AMERICAN: 41 mL/min/{1.73_m2} — AB (ref >=60)
GFR, Est Non African American: 35 mL/min/{1.73_m2} — ABNORMAL LOW (ref >=60)
GLOBULIN: 2.7 g/dL (ref 1.9–3.7)
GLUCOSE: 80 mg/dL (ref 65–99)
Potassium: 4.4 mmol/L (ref 3.5–5.3)
SODIUM: 137 mmol/L (ref 135–146)
Total Protein: 6.3 g/dL (ref 6.1–8.1)

## 2017-03-18 LAB — POCT GLYCOSYLATED HEMOGLOBIN (HGB A1C): HEMOGLOBIN A1C: 6.2

## 2017-03-18 MED ORDER — LOSARTAN POTASSIUM 100 MG PO TABS: 50.0000 mg | ORAL_TABLET | Freq: Every day | ORAL | 0 refills | Status: AC

## 2017-03-18 MED ORDER — DICLOFENAC SODIUM 1 % TD GEL: 4.0000 g | Freq: Four times a day (QID) | TRANSDERMAL | 5 refills | Status: DC

## 2017-03-18 NOTE — Progress Notes (Signed)
Subjective:    CC: BP and glucose  HPI:  Hypertension- Pt denies chest pain, SOB, dizziness, or heart palpitations.  Taking meds as directed w/o problems.  Denies medication side effects.    Impaired fasting glucose-no increased thirst or urination. No symptoms consistent with hypoglycemia.  CKD 3 -  no changes in urination.   She's also had some swelling over the PIPs in her right hand except her thumb almost the entire summer. She says they have felt very sore. She's also had some pain over the MTPs of the third and fourth digits on the palmar side of the hand. Again just feeling sore and tender at times. She has not tried any treatments or medications.   Past medical history, Surgical history, Family history not pertinant except as noted below, Social history, Allergies, and medications have been entered into the medical record, reviewed, and corrections made.   Vitals:   03/18/17 1017 03/18/17 1058  BP: (!) 161/48 (!) 142/60  Pulse: 75   SpO2: 98%      Objective:    General: Well Developed, well nourished, and in no acute distress.  Neuro: Alert and oriented x3, extra-ocular muscles intact, sensation grossly intact.  HEENT: Normocephalic, atraumatic  Skin: Warm and dry, no rashes. Cardiac: Regular rate and rhythm, no murmurs rubs or gallops, no lower extremity edema.  Respiratory: Clear to auscultation bilaterally. Not using accessory muscles, speaking in full sentences. MSK: She has some swelling and bogginess over the second through fifth PIPs on the right hand. No erythema.   Impression and Recommendations:    HTN - much improved on recheck .just Or alignment she reports good blood pressures at home. She definitely tends to have an element of whitecoat hypertension. It's always elevated when we first check it.  IFG - A1c up just slightly at 6.2. We'll continue to monitor. Continue to watch diet. Lab Results  Component Value Date   HGBA1C 6.2 03/18/2017    CKD 3 -  due to recheck renal function.   Synovitis of the right hand-she is right-handed. Will check a sedimentation rate and CRP. Consider autoimmune disorder though she is a little advanced in like to start to develop something like rheumatoid. But it is quite impressive on exam today. Recommend a trial of Voltaren gel.

## 2017-03-19 LAB — SEDIMENTATION RATE: SED RATE: 17 mm/h (ref 0–30)

## 2017-03-19 LAB — C-REACTIVE PROTEIN: CRP: 5.3 mg/L (ref <8.0)

## 2017-06-06 ENCOUNTER — Other Ambulatory Visit: Payer: Self-pay | Admitting: Family Medicine

## 2017-07-22 DIAGNOSIS — H52209 Unspecified astigmatism, unspecified eye: Secondary | ICD-10-CM | POA: Diagnosis not present

## 2017-07-22 DIAGNOSIS — H524 Presbyopia: Secondary | ICD-10-CM | POA: Diagnosis not present

## 2017-07-22 DIAGNOSIS — Z0100 Encounter for examination of eyes and vision without abnormal findings: Secondary | ICD-10-CM | POA: Diagnosis not present

## 2017-07-22 DIAGNOSIS — H5203 Hypermetropia, bilateral: Secondary | ICD-10-CM | POA: Diagnosis not present

## 2017-09-16 ENCOUNTER — Encounter: Payer: Self-pay | Admitting: Family Medicine

## 2017-09-16 ENCOUNTER — Ambulatory Visit (INDEPENDENT_AMBULATORY_CARE_PROVIDER_SITE_OTHER): Payer: Medicare HMO | Admitting: Family Medicine

## 2017-09-16 VITALS — BP 130/62 | HR 74 | Ht 60.0 in | Wt 104.0 lb

## 2017-09-16 DIAGNOSIS — R1012 Left upper quadrant pain: Secondary | ICD-10-CM | POA: Diagnosis not present

## 2017-09-16 DIAGNOSIS — N183 Chronic kidney disease, stage 3 unspecified: Secondary | ICD-10-CM

## 2017-09-16 DIAGNOSIS — I10 Essential (primary) hypertension: Secondary | ICD-10-CM

## 2017-09-16 DIAGNOSIS — R7301 Impaired fasting glucose: Secondary | ICD-10-CM

## 2017-09-16 LAB — CBC
HCT: 41.5 % (ref 35.0–45.0)
HEMOGLOBIN: 14.1 g/dL (ref 11.7–15.5)
MCH: 31.3 pg (ref 27.0–33.0)
MCHC: 34 g/dL (ref 32.0–36.0)
MCV: 92 fL (ref 80.0–100.0)
MPV: 10.3 fL (ref 7.5–12.5)
Platelets: 280 10*3/uL (ref 140–400)
RBC: 4.51 10*6/uL (ref 3.80–5.10)
RDW: 12.1 % (ref 11.0–15.0)
WBC: 7.9 10*3/uL (ref 3.8–10.8)

## 2017-09-16 LAB — COMPLETE METABOLIC PANEL WITH GFR
AG Ratio: 1.4 (calc) (ref 1.0–2.5)
ALBUMIN MSPROF: 3.9 g/dL (ref 3.6–5.1)
ALKALINE PHOSPHATASE (APISO): 62 U/L (ref 33–130)
ALT: 10 U/L (ref 6–29)
AST: 15 U/L (ref 10–35)
BUN / CREAT RATIO: 11 (calc) (ref 6–22)
BUN: 14 mg/dL (ref 7–25)
CALCIUM: 9.8 mg/dL (ref 8.6–10.4)
CHLORIDE: 100 mmol/L (ref 98–110)
CO2: 30 mmol/L (ref 20–32)
CREATININE: 1.28 mg/dL — AB (ref 0.60–0.88)
GFR, EST NON AFRICAN AMERICAN: 35 mL/min/{1.73_m2} — AB (ref >=60)
GFR, Est African American: 41 mL/min/{1.73_m2} — ABNORMAL LOW (ref >=60)
GLUCOSE: 86 mg/dL (ref 65–99)
Globulin: 2.7 g/dL (calc) (ref 1.9–3.7)
POTASSIUM: 4.1 mmol/L (ref 3.5–5.3)
SODIUM: 137 mmol/L (ref 135–146)
Total Bilirubin: 0.5 mg/dL (ref 0.2–1.2)
Total Protein: 6.6 g/dL (ref 6.1–8.1)

## 2017-09-16 LAB — POCT GLYCOSYLATED HEMOGLOBIN (HGB A1C): HEMOGLOBIN A1C: 6.1

## 2017-09-16 LAB — AMYLASE: Amylase: 95 U/L (ref 21–101)

## 2017-09-16 LAB — LIPASE: Lipase: 39 U/L (ref 7–60)

## 2017-09-16 MED ORDER — LANSOPRAZOLE 30 MG PO CPDR: 30.0000 mg | DELAYED_RELEASE_CAPSULE | Freq: Every day | ORAL | 0 refills | Status: AC

## 2017-09-16 NOTE — Progress Notes (Signed)
Subjective:    Patient ID: Karen Ball, female    DOB: 21-Nov-1917, 84 y.o.   MRN: [id number]  HPI  Hypertension- Pt denies chest pain, SOB, dizziness, or heart palpitations.  Taking meds as directed w/o problems.  Denies medication side effects.    Impaired fasting glucose-no increased thirst or urination. No symptoms consistent with hypoglycemia.  CKD 3 -overall doing well.  No new complaints with her urinating.  He also complains that she is been having some intermittent nausea often in the afternoons.  It usually happens about twice a week.  She says usually she will take some Tums and drink some flat ginger ale and normally that works.  For her IBS she will sometimes take Pepto-Bismol.  She does not associate the nausea with eating or not eating.  She also has some occasional left upper quadrant pain but says it always seems to go away on its own.  She has not been able to pinpoint any triggers.  She is not having the pain or discomfort today.  It does not seem to be associated with eating or bowel movements.  She denies any blood in the stool.  Review of Systems  There were no vitals taken for this visit.    Allergies  Allergen Reactions  . Bactrim [Sulfamethoxazole-Trimethoprim]     Nausea/hyponatremia  . Iodinated Diagnostic Agents   . Pollen Extract     Past Medical History:  Diagnosis Date  . Hearing loss    bilateral hearing aids  . Hypertension   . IBS (irritable bowel syndrome)     Past Surgical History:  Procedure Laterality Date  . ABDOMINAL HYSTERECTOMY    . cataracts     bilateral  . CHOLECYSTECTOMY    . TONSILLECTOMY      Social History   Socioeconomic History  . Marital status: Widowed    Spouse name: Not on file  . Number of children: Not on file  . Years of education: Not on file  . Highest education level: Not on file  Occupational History  . Not on file  Social Needs  . Financial resource strain: Not on file  . Food insecurity:   Worry: Not on file    Inability: Not on file  . Transportation needs:    Medical: Not on file    Non-medical: Not on file  Tobacco Use  . Smoking status: Never Smoker  . Smokeless tobacco: Never Used  Substance and Sexual Activity  . Alcohol use: No  . Drug use: No  . Sexual activity: Not on file    Comment: lives alone, 2 children, retired.  Lifestyle  . Physical activity:    Days per week: Not on file    Minutes per session: Not on file  . Stress: Not on file  Relationships  . Social connections:    Talks on phone: Not on file    Gets together: Not on file    Attends religious service: Not on file    Active member of club or organization: Not on file    Attends meetings of clubs or organizations: Not on file    Relationship status: Not on file  . Intimate partner violence:    Fear of current or ex partner: Not on file    Emotionally abused: Not on file    Physically abused: Not on file    Forced sexual activity: Not on file  Other Topics Concern  . Not on file  Social History Narrative  .  Not on file    Family History  Problem Relation Age of Onset  . Stroke Mother   . Heart attack Father   . GI problems Father   . Heart attack Brother     Outpatient Encounter Medications as of 09/16/2017  Medication Sig  . ALPRAZolam (XANAX) 0.5 MG tablet TAKE 1/2-1 TABLETS BY MOUTH EVERY DAY AS NEEDED FOR SLEEP  . AMBULATORY NON FORMULARY MEDICATION Medication Name: Sodium tablets  . aspirin 81 MG tablet Take 81 mg by mouth daily.    Marland Kitchen CARTIA XT 120 MG 24 hr capsule TAKE 1 CAPSULE EVERY DAY  . diclofenac sodium (VOLTAREN) 1 % GEL Apply 4 g topically 4 (four) times daily.  . diphenoxylate-atropine (LOMOTIL) 2.5-0.025 MG per tablet TAKE 1 TABLET BY MOUTH FOUR TIMES DAILY AS NEEDED FOR DIARRHIA OR LOOSE STOOLS  . lansoprazole (PREVACID) 30 MG capsule Take 1 capsule (30 mg total) by mouth daily at 12 noon.  Marland Kitchen losartan (COZAAR) 100 MG tablet Take 0.5 tablets (50 mg total) by mouth  daily.  . OXYTROL 3.9 MG/24HR APPLY 1 PATCH 1 TO 2 TIMES A WEEK AS DIRECTED  . raloxifene (EVISTA) 60 MG tablet TAKE 1 TABLET EVERY DAY   No facility-administered encounter medications on file as of 09/16/2017.           Objective:   Physical Exam  Constitutional: She is oriented to person, place, and time. She appears well-developed and well-nourished.  HENT:  Head: Normocephalic and atraumatic.  Cardiovascular: Normal rate, regular rhythm and normal heart sounds.  Pulmonary/Chest: Effort normal and breath sounds normal.  Abdominal: Soft. Bowel sounds are normal. She exhibits no distension and no mass. There is no tenderness. There is no rebound and no guarding.  Neurological: She is alert and oriented to person, place, and time.  Skin: Skin is warm and dry.  Psychiatric: She has a normal mood and affect. Her behavior is normal.       Assessment & Plan:  HTN -  IFG -well controlled.  Hemoglobin A1c of 6.1 today but recommend repeat in 6 months.  CDK 3-due to recheck renal function.  Nausea/gastritis-recommended trial of PPI for 6 weeks.  If not improving then please let us know.  Upper quadrant pain-unclear etiology.  She is not in pain today on exam she is nontender.  No worrisome findings.  Could be related to her bowels that she does have a history of IBS.  Encouraged her to let me know if it is persisting or becoming more frequent and we will work it up additionally.

## 2017-09-16 NOTE — Patient Instructions (Signed)
Please restart your lansoprazole.  Take about 30 minutes before your first meal of the day.  This will help turn the acid pumps off in your stomach and in case there is some irritation or an ulcer that it should hopefully heal this over about a 6-week timeframe.  If you are doing well after 6 weeks and okay to decrease the medication to every other day for 1 week and then stop completely and see how you do. If you feel like the nausea is becoming more persistent please let me know.

## 2017-12-05 ENCOUNTER — Ambulatory Visit (INDEPENDENT_AMBULATORY_CARE_PROVIDER_SITE_OTHER): Payer: Medicare HMO

## 2017-12-05 ENCOUNTER — Other Ambulatory Visit: Payer: Self-pay | Admitting: Family Medicine

## 2017-12-05 ENCOUNTER — Ambulatory Visit (INDEPENDENT_AMBULATORY_CARE_PROVIDER_SITE_OTHER): Payer: Medicare HMO | Admitting: Family Medicine

## 2017-12-05 ENCOUNTER — Encounter: Payer: Self-pay | Admitting: Family Medicine

## 2017-12-05 VITALS — BP 188/76 | HR 85 | Ht 60.0 in

## 2017-12-05 DIAGNOSIS — M25551 Pain in right hip: Secondary | ICD-10-CM | POA: Diagnosis not present

## 2017-12-05 DIAGNOSIS — R54 Age-related physical debility: Secondary | ICD-10-CM | POA: Diagnosis not present

## 2017-12-05 DIAGNOSIS — W19XXXA Unspecified fall, initial encounter: Secondary | ICD-10-CM

## 2017-12-05 DIAGNOSIS — M533 Sacrococcygeal disorders, not elsewhere classified: Secondary | ICD-10-CM

## 2017-12-05 DIAGNOSIS — F5101 Primary insomnia: Secondary | ICD-10-CM | POA: Diagnosis not present

## 2017-12-05 DIAGNOSIS — S79911A Unspecified injury of right hip, initial encounter: Secondary | ICD-10-CM | POA: Diagnosis not present

## 2017-12-05 MED ORDER — ALPRAZOLAM 0.25 MG PO TABS: 0.1250 mg | ORAL_TABLET | Freq: Every evening | ORAL | 0 refills | Status: AC | PRN

## 2017-12-05 NOTE — Patient Instructions (Signed)
Continue to use the heating pad Can alternate Ibuprofen 400mg  with Tylenol 500mg  every 4 hours.

## 2017-12-05 NOTE — Progress Notes (Signed)
Subjective:    Patient ID: Karen Ball, female    DOB: 1918/01/21, 84 y.o.   MRN: [id number]  HPI 84 year old female is here today for right hip pain.  2 days ago she was got out of the car and was walking down her sidewalk into the house when she suddenly fell.  She said it almost felt like something was pulling at her and she could not quite get her balance and fell.  She fell on her right hip and buttock area.  She thinks she may have actually landed on her purse which she had on her right arm.  She now has a bruise on the dorsum of her right wrist. She did take some IBU 400 mg that day and it did help.  She is also been using her heating pad.  She has not taken any ibuprofen or pain relievers since then.  She woke up this morning with pain actually radiating all the way down her leg which she had not had up until that point and so she called to make an appointment.  Her daughter who is from [place] is here visiting took her to get a pedicure today and she sat in the chair that massages and she says that actually helped with some of her pain going down the leg.  Follow-up nausea/gastritis - I last saw her in March I recommended that she restart a proton pump inhibitor.  Insomnia-she did ask if she can have some alprazolam to take occasionally if needed.  She has not used them in quite some time.  She is also requesting a Handicap form for the car.    Review of Systems  BP (!) 190/62   Pulse 85   Ht 5' (1.524 m)   BMI 20.31 kg/m     Allergies  Allergen Reactions  . Bactrim [Sulfamethoxazole-Trimethoprim]     Nausea/hyponatremia  . Iodinated Diagnostic Agents   . Pollen Extract     Past Medical History:  Diagnosis Date  . Hearing loss    bilateral hearing aids  . Hypertension   . IBS (irritable bowel syndrome)     Past Surgical History:  Procedure Laterality Date  . ABDOMINAL HYSTERECTOMY    . cataracts     bilateral  . CHOLECYSTECTOMY    . TONSILLECTOMY      Social  History   Socioeconomic History  . Marital status: Widowed    Spouse name: Not on file  . Number of children: Not on file  . Years of education: Not on file  . Highest education level: Not on file  Occupational History  . Not on file  Social Needs  . Financial resource strain: Not on file  . Food insecurity:    Worry: Not on file    Inability: Not on file  . Transportation needs:    Medical: Not on file    Non-medical: Not on file  Tobacco Use  . Smoking status: Never Smoker  . Smokeless tobacco: Never Used  Substance and Sexual Activity  . Alcohol use: No  . Drug use: No  . Sexual activity: Not on file    Comment: lives alone, 2 children, retired.  Lifestyle  . Physical activity:    Days per week: Not on file    Minutes per session: Not on file  . Stress: Not on file  Relationships  . Social connections:    Talks on phone: Not on file    Gets together: Not on file  Attends religious service: Not on file    Active member of club or organization: Not on file    Attends meetings of clubs or organizations: Not on file    Relationship status: Not on file  . Intimate partner violence:    Fear of current or ex partner: Not on file    Emotionally abused: Not on file    Physically abused: Not on file    Forced sexual activity: Not on file  Other Topics Concern  . Not on file  Social History Narrative  . Not on file    Family History  Problem Relation Age of Onset  . Stroke Mother   . Heart attack Father   . GI problems Father   . Heart attack Brother     Outpatient Encounter Medications as of 12/05/2017  Medication Sig  . AMBULATORY NON FORMULARY MEDICATION Medication Name: Sodium tablets  . aspirin 81 MG tablet Take 81 mg by mouth daily.    Marland Kitchen CARTIA XT 120 MG 24 hr capsule TAKE 1 CAPSULE EVERY DAY  . diphenoxylate-atropine (LOMOTIL) 2.5-0.025 MG per tablet TAKE 1 TABLET BY MOUTH FOUR TIMES DAILY AS NEEDED FOR DIARRHIA OR LOOSE STOOLS  . lansoprazole  (PREVACID) 30 MG capsule Take 1 capsule (30 mg total) by mouth daily at 12 noon.  Marland Kitchen losartan (COZAAR) 100 MG tablet Take 0.5 tablets (50 mg total) by mouth daily.  . OXYTROL 3.9 MG/24HR APPLY 1 PATCH 1 TO 2 TIMES A WEEK AS DIRECTED  . raloxifene (EVISTA) 60 MG tablet TAKE 1 TABLET EVERY DAY  . ALPRAZolam (XANAX) 0.25 MG tablet Take 0.5-1 tablets (0.125-0.25 mg total) by mouth at bedtime as needed for anxiety.   No facility-administered encounter medications on file as of 12/05/2017.         Objective:   Physical Exam  Constitutional: She is oriented to person, place, and time. She appears well-developed and well-nourished.  HENT:  Head: Normocephalic and atraumatic.  Eyes: Conjunctivae and EOM are normal.  Cardiovascular: Normal rate.  Pulmonary/Chest: Effort normal.  Musculoskeletal:  Very tender over the coccyx and tender over the lateral hip joint over the bursa.  She has a small bruise and Ball mark over the lateral hip. Pain with hip flexion against resistant. Some pain with internal rotation.   Neurological: She is alert and oriented to person, place, and time.  Skin: Skin is dry. No pallor.  Psychiatric: She has a normal mood and affect. Her behavior is normal.  Vitals reviewed.     Assessment & Plan:  Status post fall/coccygeal pain -we will get x-ray to rule out fracture.  She says that she actually fell years ago and actually broke her coccyx before so it may be that is permanently displaced.  And she may just have a contusion.  Status post fall/right hip pain-evaluate for fracture.  She has been able to walk on it so it may not be significant but she certainly could have a hairline fracture.  It also may just be a soft tissue contusion which hopefully is all that it is.  In the interim she can alternate ibuprofen and Tylenol for pain until we have a definitive diagnosis.  Frail elderly-we will complete form for handicap placard for vehicle.  Insomnia-infrequent.  I will  fill a very small amount of alprazolam but warned about the potential for increased sedation, falls and dementia.  If she uses it which is very infrequent she only takes a half a tab.  And 30  tabs have lasted her over a year.  Elevated blood pressure-secondary to pain.  Will monitor carefully.

## 2017-12-06 ENCOUNTER — Telehealth: Payer: Self-pay

## 2017-12-06 DIAGNOSIS — R54 Age-related physical debility: Secondary | ICD-10-CM

## 2017-12-06 DIAGNOSIS — W19XXXA Unspecified fall, initial encounter: Secondary | ICD-10-CM

## 2017-12-06 NOTE — Telephone Encounter (Signed)
Karen Ball of recommendations. She would like to get recommendations on which kind of walker would be best. Please advise.

## 2017-12-06 NOTE — Telephone Encounter (Signed)
I was just going to put the handicap form in the mail unless she wants to pick it up.  We haven't mailed it yet.  I didn't have time to do it during the OV.  I think she is still ok to live alone for now but I do think the family should really start to look into alternative living situation so that if she falls there is someone close by.

## 2017-12-06 NOTE — Telephone Encounter (Signed)
Rolling walker with seat.  Ok to print a rx for her.

## 2017-12-06 NOTE — Telephone Encounter (Signed)
Izora Gala, pt's daughter, called stating she did not come to the visit yesterday with Dr Madilyn Fireman and states that she has some questions regarding the x-rays and some other issues.   I called Izora Gala and gave her the results of pt's x-ray.   Izora Gala states she is currently talking with insurance about getting patient a walker.   She wants to know if Dr Madilyn Fireman thinks that patient is okay to live alone? Izora Gala states pt was doing great up until recent fall.   She is also wondering about handicap form for pt. Izora Gala believes it was brought up at visit but not sure if it was completed.   Please advise. Thanks!!

## 2017-12-09 NOTE — Telephone Encounter (Signed)
Printed and in Dr Gardiner Ramus box to sign.

## 2017-12-11 ENCOUNTER — Telehealth: Payer: Self-pay | Admitting: Family Medicine

## 2017-12-11 DIAGNOSIS — R54 Age-related physical debility: Secondary | ICD-10-CM

## 2017-12-11 DIAGNOSIS — W19XXXA Unspecified fall, initial encounter: Secondary | ICD-10-CM

## 2017-12-11 DIAGNOSIS — M25551 Pain in right hip: Secondary | ICD-10-CM

## 2017-12-11 MED ORDER — AMBULATORY NON FORMULARY MEDICATION: 0 refills | Status: DC

## 2017-12-11 NOTE — Telephone Encounter (Signed)
rx printed

## 2017-12-11 NOTE — Telephone Encounter (Signed)
OK for both

## 2017-12-11 NOTE — Telephone Encounter (Signed)
Pt's daughter called requesting HH order for PT and requested Rx for a walker. Will route to PCP.

## 2017-12-12 ENCOUNTER — Telehealth: Payer: Self-pay

## 2017-12-12 NOTE — Telephone Encounter (Signed)
June from [facility] wanted to let Dr Madilyn Fireman know there is a delay in care due to pt's daughter wanting start of care to begin tomorrow.   FYI to Dr Madilyn Fireman

## 2017-12-13 ENCOUNTER — Other Ambulatory Visit: Payer: Self-pay

## 2017-12-13 ENCOUNTER — Telehealth: Payer: Self-pay

## 2017-12-13 DIAGNOSIS — M25551 Pain in right hip: Secondary | ICD-10-CM | POA: Diagnosis not present

## 2017-12-13 DIAGNOSIS — I872 Venous insufficiency (chronic) (peripheral): Secondary | ICD-10-CM | POA: Diagnosis not present

## 2017-12-13 DIAGNOSIS — K589 Irritable bowel syndrome without diarrhea: Secondary | ICD-10-CM | POA: Diagnosis not present

## 2017-12-13 DIAGNOSIS — R54 Age-related physical debility: Secondary | ICD-10-CM

## 2017-12-13 DIAGNOSIS — M6281 Muscle weakness (generalized): Secondary | ICD-10-CM | POA: Diagnosis not present

## 2017-12-13 DIAGNOSIS — W19XXXA Unspecified fall, initial encounter: Secondary | ICD-10-CM

## 2017-12-13 DIAGNOSIS — R269 Unspecified abnormalities of gait and mobility: Secondary | ICD-10-CM | POA: Diagnosis not present

## 2017-12-13 DIAGNOSIS — F411 Generalized anxiety disorder: Secondary | ICD-10-CM | POA: Diagnosis not present

## 2017-12-13 DIAGNOSIS — I10 Essential (primary) hypertension: Secondary | ICD-10-CM | POA: Diagnosis not present

## 2017-12-13 MED ORDER — AMBULATORY NON FORMULARY MEDICATION: 0 refills | Status: AC

## 2017-12-13 NOTE — Telephone Encounter (Signed)
June with [facility] was able to do evaluation.  Current plan of care is to see pt two times a week for 4 weeks to work on pain management, gait, home exercises, and balance.  [place] for verbal order given.  FYI to Dr Madilyn Fireman

## 2017-12-13 NOTE — Telephone Encounter (Signed)
Agree with above. Thank you

## 2017-12-16 ENCOUNTER — Telehealth: Payer: Self-pay | Admitting: Family Medicine

## 2017-12-16 NOTE — Telephone Encounter (Signed)
Routing for scheduling

## 2017-12-16 NOTE — Telephone Encounter (Signed)
Dr Lacy Duverney called and stated that Karen Ball is still in a lot of pain from her fall 2 weeks ago and would like to bring her back in but your first available is not until 7/5 please advise - CF

## 2017-12-16 NOTE — Telephone Encounter (Signed)
OK, to put her in acute slot for tomorrow.

## 2017-12-17 ENCOUNTER — Ambulatory Visit (INDEPENDENT_AMBULATORY_CARE_PROVIDER_SITE_OTHER): Payer: Medicare HMO | Admitting: Family Medicine

## 2017-12-17 ENCOUNTER — Encounter: Payer: Self-pay | Admitting: Family Medicine

## 2017-12-17 VITALS — BP 148/64 | HR 63

## 2017-12-17 DIAGNOSIS — R197 Diarrhea, unspecified: Secondary | ICD-10-CM | POA: Diagnosis not present

## 2017-12-17 DIAGNOSIS — W19XXXA Unspecified fall, initial encounter: Secondary | ICD-10-CM

## 2017-12-17 DIAGNOSIS — R269 Unspecified abnormalities of gait and mobility: Secondary | ICD-10-CM | POA: Diagnosis not present

## 2017-12-17 DIAGNOSIS — I872 Venous insufficiency (chronic) (peripheral): Secondary | ICD-10-CM | POA: Diagnosis not present

## 2017-12-17 DIAGNOSIS — M5410 Radiculopathy, site unspecified: Secondary | ICD-10-CM | POA: Diagnosis not present

## 2017-12-17 DIAGNOSIS — K589 Irritable bowel syndrome without diarrhea: Secondary | ICD-10-CM | POA: Diagnosis not present

## 2017-12-17 DIAGNOSIS — F411 Generalized anxiety disorder: Secondary | ICD-10-CM | POA: Diagnosis not present

## 2017-12-17 DIAGNOSIS — R54 Age-related physical debility: Secondary | ICD-10-CM | POA: Diagnosis not present

## 2017-12-17 DIAGNOSIS — M6281 Muscle weakness (generalized): Secondary | ICD-10-CM | POA: Diagnosis not present

## 2017-12-17 DIAGNOSIS — M25551 Pain in right hip: Secondary | ICD-10-CM

## 2017-12-17 DIAGNOSIS — I10 Essential (primary) hypertension: Secondary | ICD-10-CM | POA: Diagnosis not present

## 2017-12-17 MED ORDER — DIPHENOXYLATE-ATROPINE 2.5-0.025 MG PO TABS: ORAL_TABLET | ORAL | 1 refills | Status: AC

## 2017-12-17 MED ORDER — HYDROCODONE-ACETAMINOPHEN 5-325 MG PO TABS: 0.5000 | ORAL_TABLET | Freq: Four times a day (QID) | ORAL | 0 refills | Status: AC | PRN

## 2017-12-17 MED ORDER — PREDNISONE 20 MG PO TABS: 40.0000 mg | ORAL_TABLET | Freq: Every day | ORAL | 0 refills | Status: AC

## 2017-12-17 NOTE — Patient Instructions (Addendum)
Please hold the aspirin while taking the prednisone.  Do not take ibuprofen or Aleve while taking the prednisone.  Make sure to take a prednisone with a little bit of food and water.  Stop immediately if any stomach upset or pain after taking the medication.  If not improving please call the office back and we will get you in with either Dr. Georgina Snell or Dr. Dianah Field for further assessment and work-up.

## 2017-12-17 NOTE — Progress Notes (Signed)
   Subjective:    Patient ID: Karen Ball, female    DOB: 1917/06/28, 84 y.o.   MRN: [id number]  HPI 84 year old female comes in today to follow-up for right hip pain.  She is here today to follow-up for recent fall.  Please see previous note from June 13.  X-rays of the right hip were negative.  X-ray of the coccyx showed fracture.  Though she has had an old fracture there years ago so was difficult to tell if this truly was new or not.  Since then she is continued to have some right sided pain particularly starting in the buttock area and radiating posteriorly down to the right hip.  Never passes the knee.  She is not having any pain across the anterior hip.  She is not having any significant pain over the coccyx currently.  She said she did initially but that is actually gotten better.  She is continue to alternate Tylenol and ibuprofen.  But today her pain is been persistent and has not let up since about 630 this morning.  She did have an ache in her low back yesterday but that part is a little bit better today.  She is able to walk with her walker but is in a wheelchair today.  Diarrhea-mostly she just uses Pepto-Bismol but every once in a while she will use Lomotil.  It was last filled about 2 years ago and she says she used up her last tab a couple of days ago and would like a refill.  She tries to use it very sparingly.   Review of Systems     Objective:   Physical Exam  Constitutional: She is oriented to person, place, and time. She appears well-developed and well-nourished.  HENT:  Head: Normocephalic and atraumatic.  Eyes: Conjunctivae and EOM are normal.  Cardiovascular: Normal rate.  Pulmonary/Chest: Effort normal.  Musculoskeletal:  I am able to internally and externally rotate the hip.  But she had some pain going down the posterior thigh with external rotation of the hip.  She is able to with some discomfort.  Strength is symmetric in the right hip and the left hip.   Neurological: She is alert and oriented to person, place, and time.  Skin: Skin is dry. No pallor.  Psychiatric: She has a normal mood and affect. Her behavior is normal.  Vitals reviewed.      Assessment & Plan:  Right buttock pain s/p fall -I suspect that she probably has some sciatica and that this may actually be coming more from her low back if she is not having pain directly over the coccyx or over the right anterior hip.  I like to treat with oral prednisone.  Call if not improving by the end of the week.  If not improving consider lumbar spine film.  Also for send over a small quantity of hydrocodone to use sparingly and warned about potential for sedation and to stop using it if it is causing symptoms or sedation.  To use the Tylenol while taking the prednisone.  Her daughter wanted to know if she could also try TENS unit and I think that that is absolutely reasonable.  Diarrhea-refilled Lomotil today.  Continue to use sparingly.  Completed handicap form today.

## 2017-12-20 ENCOUNTER — Telehealth: Payer: Self-pay | Admitting: Family Medicine

## 2017-12-20 DIAGNOSIS — R269 Unspecified abnormalities of gait and mobility: Secondary | ICD-10-CM | POA: Diagnosis not present

## 2017-12-20 DIAGNOSIS — I10 Essential (primary) hypertension: Secondary | ICD-10-CM | POA: Diagnosis not present

## 2017-12-20 DIAGNOSIS — M25551 Pain in right hip: Secondary | ICD-10-CM | POA: Diagnosis not present

## 2017-12-20 DIAGNOSIS — I872 Venous insufficiency (chronic) (peripheral): Secondary | ICD-10-CM | POA: Diagnosis not present

## 2017-12-20 DIAGNOSIS — F411 Generalized anxiety disorder: Secondary | ICD-10-CM | POA: Diagnosis not present

## 2017-12-20 DIAGNOSIS — M6281 Muscle weakness (generalized): Secondary | ICD-10-CM | POA: Diagnosis not present

## 2017-12-20 DIAGNOSIS — K589 Irritable bowel syndrome without diarrhea: Secondary | ICD-10-CM | POA: Diagnosis not present

## 2017-12-20 DIAGNOSIS — R54 Age-related physical debility: Secondary | ICD-10-CM | POA: Diagnosis not present

## 2017-12-20 NOTE — Telephone Encounter (Signed)
Agree with above. Thank you

## 2017-12-20 NOTE — Telephone Encounter (Signed)
Pt's daughter called to give update this am on her mother. States yesterday her mother was very weak, but only had to take one pain pill. She reports the pain is better when she is laying down, so did has been laying a lot. She does have PT this morning at 09900, so based on that she will determine if an appt with sports med is needed. No further questions at this time.

## 2017-12-23 ENCOUNTER — Encounter: Payer: Self-pay | Admitting: Sports Medicine

## 2017-12-23 ENCOUNTER — Ambulatory Visit (INDEPENDENT_AMBULATORY_CARE_PROVIDER_SITE_OTHER): Payer: Medicare HMO

## 2017-12-23 ENCOUNTER — Ambulatory Visit (INDEPENDENT_AMBULATORY_CARE_PROVIDER_SITE_OTHER): Payer: Medicare HMO | Admitting: Sports Medicine

## 2017-12-23 DIAGNOSIS — G8929 Other chronic pain: Secondary | ICD-10-CM

## 2017-12-23 DIAGNOSIS — M5441 Lumbago with sciatica, right side: Principal | ICD-10-CM

## 2017-12-23 DIAGNOSIS — S322XXD Fracture of coccyx, subsequent encounter for fracture with routine healing: Secondary | ICD-10-CM

## 2017-12-23 DIAGNOSIS — R531 Weakness: Secondary | ICD-10-CM | POA: Diagnosis not present

## 2017-12-23 DIAGNOSIS — M545 Low back pain, unspecified: Secondary | ICD-10-CM | POA: Insufficient documentation

## 2017-12-23 DIAGNOSIS — M48061 Spinal stenosis, lumbar region without neurogenic claudication: Secondary | ICD-10-CM

## 2017-12-23 DIAGNOSIS — W19XXXD Unspecified fall, subsequent encounter: Secondary | ICD-10-CM

## 2017-12-23 MED ORDER — TRAMADOL HCL 50 MG PO TABS: 50.0000 mg | ORAL_TABLET | Freq: Three times a day (TID) | ORAL | 0 refills | Status: AC | PRN

## 2017-12-23 NOTE — Assessment & Plan Note (Signed)
With a slight change in personality, urinalysis with reflex culture, CBC, CMP. If unrevealing we will need to do a brain MRI.

## 2017-12-23 NOTE — Progress Notes (Addendum)
Subjective:    CC: Low back pain  HPI: This is a pleasant 84 year old female with multifactorial chronic low back pain currently on narcotics for treatment.  More recently she had worsening of pain in her back, she localizes the pain over the sacroiliac joint, with occasional radiation down the leg with numbness in the foot.  No bowel or bladder dysfunction, saddle numbness, constitutional symptoms, she has had a few falls.  In addition she is had worsening weakness, history of multiple UTIs, unable to give a urine specimen today.  I reviewed the past medical history, family history, social history, surgical history, and allergies today and no changes were needed.  Please see the problem list section below in epic for further details.  Past Medical History: Past Medical History:  Diagnosis Date  . Hearing loss    bilateral hearing aids  . Hypertension   . IBS (irritable bowel syndrome)    Past Surgical History: Past Surgical History:  Procedure Laterality Date  . ABDOMINAL HYSTERECTOMY    . cataracts     bilateral  . CHOLECYSTECTOMY    . TONSILLECTOMY     Social History: Social History   Socioeconomic History  . Marital status: Widowed    Spouse name: Not on file  . Number of children: Not on file  . Years of education: Not on file  . Highest education level: Not on file  Occupational History  . Not on file  Social Needs  . Financial resource strain: Not on file  . Food insecurity:    Worry: Not on file    Inability: Not on file  . Transportation needs:    Medical: Not on file    Non-medical: Not on file  Tobacco Use  . Smoking status: Never Smoker  . Smokeless tobacco: Never Used  Substance and Sexual Activity  . Alcohol use: No  . Drug use: No  . Sexual activity: Not on file    Comment: lives alone, 2 children, retired.  Lifestyle  . Physical activity:    Days per week: Not on file    Minutes per session: Not on file  . Stress: Not on file    Relationships  . Social connections:    Talks on phone: Not on file    Gets together: Not on file    Attends religious service: Not on file    Active member of club or organization: Not on file    Attends meetings of clubs or organizations: Not on file    Relationship status: Not on file  Other Topics Concern  . Not on file  Social History Narrative  . Not on file   Family History: Family History  Problem Relation Age of Onset  . Stroke Mother   . Heart attack Father   . GI problems Father   . Heart attack Brother    Allergies: Allergies  Allergen Reactions  . Bactrim [Sulfamethoxazole-Trimethoprim]     Nausea/hyponatremia  . Iodinated Diagnostic Agents   . Pollen Extract    Medications: See med rec.  Review of Systems: No fevers, chills, night sweats, weight loss, chest pain, or shortness of breath.   Objective:    General: Well Developed, well nourished, and in no acute distress.  Neuro: Alert and oriented x3, extra-ocular muscles intact, sensation grossly intact.  HEENT: Normocephalic, atraumatic, pupils equal round reactive to light, neck supple, no masses, no lymphadenopathy, thyroid nonpalpable.  Skin: Warm and dry, no rashes. Cardiac: Regular rate and rhythm, no murmurs  rubs or gallops, no lower extremity edema.  Respiratory: Clear to auscultation bilaterally. Not using accessory muscles, speaking in full sentences. Back Exam:  Inspection: Unremarkable  Motion: Flexion 45 deg, Extension 45 deg, Side Bending to 45 deg bilaterally,  Rotation to 45 deg bilaterally  SLR laying: Negative  XSLR laying: Negative  Palpable tenderness: Right sacroiliac joint. FABER: negative. Sensory change: Gross sensation intact to all lumbar and sacral dermatomes.  Reflexes: 2+ at both patellar tendons, 2+ at achilles tendons, Babinski's downgoing.  Strength at foot  Plantar-flexion: 5/5 Dorsi-flexion: 5/5 Eversion: 5/5 Inversion: 5/5  Leg strength  Quad: 5/5 Hamstring: 5/5  Hip flexor: 5/5 Hip abductors: 5/5  Gait unremarkable.  Procedure: Real-time Ultrasound Guided Injection of right sacroiliac joint Device: GE Logiq E  Verbal informed consent obtained.  Time-out conducted.  Noted no overlying erythema, induration, or other signs of local infection.  Skin prepped in a sterile fashion.  Local anesthesia: Topical Ethyl chloride.  With sterile technique and under real time ultrasound guidance: 1 cc kenalog 40, 2 cc lidocaine, 2 cc bupivacaine injected easily Completed without difficulty  Pain immediately resolved suggesting accurate placement of the medication.  Advised to call if fevers/chills, erythema, induration, drainage, or persistent bleeding.  Images permanently stored and available for review in the ultrasound unit.  Impression: Technically successful ultrasound guided injection.  Impression and Recommendations:    Low back pain Axial low back pain, referral to the SI joint, also right lumbar radiculitis. SI joint injection today, switching down to tramadol. Continue home health physical therapy. Lumbar spine x-rays, and possibly MRI if no improvement in about 3 weeks.  Considering elevated alkaline phosphatase, back pain I would like to add a serum protein electrophoresis to the blood already in the lab if able (looking for evidence of multiple myeloma).  Also with increased white blood cell count adding Macrobid assuming that we have a urine specimen in the lab.  Weakness With a slight change in personality, urinalysis with reflex culture, CBC, CMP. If unrevealing we will need to do a brain MRI. ___________________________________________ Gwen Her. Dianah Field, M.D., ABFM., CAQSM. Primary Care and [place] Instructor of [employer] [facility] [employer]

## 2017-12-23 NOTE — Assessment & Plan Note (Addendum)
Axial low back pain, referral to the SI joint, also right lumbar radiculitis. SI joint injection today, switching down to tramadol. Continue home health physical therapy. Lumbar spine x-rays, and possibly MRI if no improvement in about 3 weeks.  Considering elevated alkaline phosphatase, back pain I would like to add a serum protein electrophoresis to the blood already in the lab if able (looking for evidence of multiple myeloma).  Also with increased white blood cell count adding Macrobid assuming that we have a urine specimen in the lab.

## 2017-12-24 DIAGNOSIS — F411 Generalized anxiety disorder: Secondary | ICD-10-CM | POA: Diagnosis not present

## 2017-12-24 DIAGNOSIS — M6281 Muscle weakness (generalized): Secondary | ICD-10-CM | POA: Diagnosis not present

## 2017-12-24 DIAGNOSIS — K589 Irritable bowel syndrome without diarrhea: Secondary | ICD-10-CM | POA: Diagnosis not present

## 2017-12-24 DIAGNOSIS — R269 Unspecified abnormalities of gait and mobility: Secondary | ICD-10-CM | POA: Diagnosis not present

## 2017-12-24 DIAGNOSIS — I872 Venous insufficiency (chronic) (peripheral): Secondary | ICD-10-CM | POA: Diagnosis not present

## 2017-12-24 DIAGNOSIS — I10 Essential (primary) hypertension: Secondary | ICD-10-CM | POA: Diagnosis not present

## 2017-12-24 DIAGNOSIS — R54 Age-related physical debility: Secondary | ICD-10-CM | POA: Diagnosis not present

## 2017-12-24 DIAGNOSIS — M25551 Pain in right hip: Secondary | ICD-10-CM | POA: Diagnosis not present

## 2017-12-24 MED ORDER — NITROFURANTOIN MONOHYD MACRO 100 MG PO CAPS: 100.0000 mg | ORAL_CAPSULE | Freq: Two times a day (BID) | ORAL | 0 refills | Status: AC

## 2017-12-24 NOTE — Addendum Note (Signed)
Addended by: Silverio Decamp on: 12/24/2017 09:11 AM   Modules accepted: Orders

## 2017-12-25 ENCOUNTER — Telehealth: Payer: Self-pay | Admitting: *Deleted

## 2017-12-25 DIAGNOSIS — W19XXXA Unspecified fall, initial encounter: Secondary | ICD-10-CM | POA: Diagnosis not present

## 2017-12-25 DIAGNOSIS — Z515 Encounter for palliative care: Secondary | ICD-10-CM | POA: Diagnosis not present

## 2017-12-25 DIAGNOSIS — R339 Retention of urine, unspecified: Secondary | ICD-10-CM | POA: Diagnosis not present

## 2017-12-25 DIAGNOSIS — R338 Other retention of urine: Secondary | ICD-10-CM | POA: Diagnosis not present

## 2017-12-25 DIAGNOSIS — M48061 Spinal stenosis, lumbar region without neurogenic claudication: Secondary | ICD-10-CM | POA: Diagnosis not present

## 2017-12-25 DIAGNOSIS — E871 Hypo-osmolality and hyponatremia: Secondary | ICD-10-CM | POA: Diagnosis not present

## 2017-12-25 DIAGNOSIS — Z7189 Other specified counseling: Secondary | ICD-10-CM | POA: Diagnosis not present

## 2017-12-25 DIAGNOSIS — H8110 Benign paroxysmal vertigo, unspecified ear: Secondary | ICD-10-CM | POA: Diagnosis not present

## 2017-12-25 DIAGNOSIS — H8113 Benign paroxysmal vertigo, bilateral: Secondary | ICD-10-CM | POA: Diagnosis not present

## 2017-12-25 DIAGNOSIS — R52 Pain, unspecified: Secondary | ICD-10-CM | POA: Diagnosis not present

## 2017-12-25 DIAGNOSIS — I639 Cerebral infarction, unspecified: Secondary | ICD-10-CM | POA: Diagnosis not present

## 2017-12-25 DIAGNOSIS — J449 Chronic obstructive pulmonary disease, unspecified: Secondary | ICD-10-CM | POA: Diagnosis not present

## 2017-12-25 DIAGNOSIS — M47896 Other spondylosis, lumbar region: Secondary | ICD-10-CM | POA: Diagnosis not present

## 2017-12-25 DIAGNOSIS — M4316 Spondylolisthesis, lumbar region: Secondary | ICD-10-CM | POA: Diagnosis not present

## 2017-12-25 DIAGNOSIS — G8921 Chronic pain due to trauma: Secondary | ICD-10-CM | POA: Diagnosis not present

## 2017-12-25 DIAGNOSIS — N183 Chronic kidney disease, stage 3 (moderate): Secondary | ICD-10-CM | POA: Diagnosis not present

## 2017-12-25 DIAGNOSIS — I6389 Other cerebral infarction: Secondary | ICD-10-CM | POA: Diagnosis not present

## 2017-12-25 DIAGNOSIS — K219 Gastro-esophageal reflux disease without esophagitis: Secondary | ICD-10-CM | POA: Diagnosis not present

## 2017-12-25 DIAGNOSIS — R29810 Facial weakness: Secondary | ICD-10-CM | POA: Diagnosis not present

## 2017-12-25 DIAGNOSIS — D72829 Elevated white blood cell count, unspecified: Secondary | ICD-10-CM | POA: Diagnosis not present

## 2017-12-25 DIAGNOSIS — R748 Abnormal levels of other serum enzymes: Secondary | ICD-10-CM | POA: Diagnosis not present

## 2017-12-25 DIAGNOSIS — I160 Hypertensive urgency: Secondary | ICD-10-CM | POA: Diagnosis not present

## 2017-12-25 DIAGNOSIS — M8448XA Pathological fracture, other site, initial encounter for fracture: Secondary | ICD-10-CM | POA: Diagnosis not present

## 2017-12-25 DIAGNOSIS — M5136 Other intervertebral disc degeneration, lumbar region: Secondary | ICD-10-CM | POA: Diagnosis not present

## 2017-12-25 DIAGNOSIS — G9341 Metabolic encephalopathy: Secondary | ICD-10-CM | POA: Diagnosis not present

## 2017-12-25 DIAGNOSIS — M8448XG Pathological fracture, other site, subsequent encounter for fracture with delayed healing: Secondary | ICD-10-CM | POA: Diagnosis not present

## 2017-12-25 DIAGNOSIS — R531 Weakness: Secondary | ICD-10-CM | POA: Diagnosis not present

## 2017-12-25 DIAGNOSIS — F411 Generalized anxiety disorder: Secondary | ICD-10-CM | POA: Diagnosis not present

## 2017-12-25 DIAGNOSIS — I10 Essential (primary) hypertension: Secondary | ICD-10-CM | POA: Diagnosis not present

## 2017-12-25 DIAGNOSIS — Z66 Do not resuscitate: Secondary | ICD-10-CM | POA: Diagnosis not present

## 2017-12-25 DIAGNOSIS — E222 Syndrome of inappropriate secretion of antidiuretic hormone: Secondary | ICD-10-CM | POA: Diagnosis not present

## 2017-12-25 NOTE — Telephone Encounter (Signed)
Patient daughter called and states there has been a complete change in her activity and ability to move around.

## 2017-12-25 NOTE — Telephone Encounter (Signed)
Called [facility], spoke w/Karen she informed me that an Aid is temporary and she would also need to have OT to come out to evaluate. She would get up to [place] visits and a Home health evaluation would need to be done. I asked her to go ahead and move forward with this.Karen Ball, [place]

## 2017-12-25 NOTE — Telephone Encounter (Signed)
I was speaking with pt's daughter about some results of tests that Dr. Darene Lamer ordered and she was wanting to know if you could order and aid to help with daily living activities at least a few times a week.  She has a therapist that is coming out but daughter would like a little help with daily things.

## 2017-12-25 NOTE — Telephone Encounter (Signed)
Please call the home health she is getting PT through and see if can give a verbal order for an aid.

## 2017-12-25 NOTE — Telephone Encounter (Signed)
Per Dr Madilyn Fireman, patient's daughter advised to take her to the ED.

## 2017-12-26 LAB — URINALYSIS W MICROSCOPIC + REFLEX CULTURE
Bacteria, UA: NONE SEEN /HPF
Bilirubin Urine: NEGATIVE
Glucose, UA: NEGATIVE
Hgb urine dipstick: NEGATIVE
Hyaline Cast: NONE SEEN /LPF
Ketones, ur: NEGATIVE
Leukocyte Esterase: NEGATIVE
Nitrites, Initial: NEGATIVE
Protein, ur: NEGATIVE
RBC / HPF: NONE SEEN /HPF (ref 0–2)
Specific Gravity, Urine: 1.011 (ref 1.001–1.03)
WBC, UA: NONE SEEN /HPF (ref 0–5)
pH: 8 (ref 5.0–8.0)

## 2017-12-26 LAB — NO CULTURE INDICATED

## 2017-12-27 ENCOUNTER — Telehealth: Payer: Self-pay | Admitting: Family Medicine

## 2017-12-27 LAB — CBC WITH DIFFERENTIAL/PLATELET
Basophils Absolute: 23 {cells}/uL (ref 0–200)
Basophils Relative: 0.2 %
Eosinophils Absolute: 0 {cells}/uL — ABNORMAL LOW (ref 15–500)
Eosinophils Relative: 0 %
HCT: 38.7 % (ref 35.0–45.0)
Hemoglobin: 12.9 g/dL (ref 11.7–15.5)
Lymphs Abs: 725 cells/uL — ABNORMAL LOW (ref 850–3900)
MCH: 31.7 pg (ref 27.0–33.0)
MCHC: 33.3 g/dL (ref 32.0–36.0)
MCV: 95.1 fL (ref 80.0–100.0)
MPV: 9.5 fL (ref 7.5–12.5)
Monocytes Relative: 2 %
Neutro Abs: 10523 {cells}/uL — ABNORMAL HIGH (ref 1500–7800)
Neutrophils Relative %: 91.5 %
Platelets: 399 10*3/uL (ref 140–400)
RBC: 4.07 10*6/uL (ref 3.80–5.10)
RDW: 12.6 % (ref 11.0–15.0)
Total Lymphocyte: 6.3 %
WBC mixed population: 230 {cells}/uL (ref 200–950)
WBC: 11.5 10*3/uL — ABNORMAL HIGH (ref 3.8–10.8)

## 2017-12-27 LAB — COMPREHENSIVE METABOLIC PANEL
AG Ratio: 1.4 (calc) (ref 1.0–2.5)
Albumin: 3.8 g/dL (ref 3.6–5.1)
Alkaline phosphatase (APISO): 305 U/L — ABNORMAL HIGH (ref 33–130)
BUN: 19 mg/dL (ref 7–25)
Calcium: 9.5 mg/dL (ref 8.6–10.4)
Creat: 1.09 mg/dL — ABNORMAL HIGH (ref 0.60–0.88)
Potassium: 4.3 mmol/L (ref 3.5–5.3)
Sodium: 134 mmol/L — ABNORMAL LOW (ref 135–146)
Total Bilirubin: 0.5 mg/dL (ref 0.2–1.2)
Total Protein: 6.5 g/dL (ref 6.1–8.1)

## 2017-12-27 LAB — COMPREHENSIVE METABOLIC PANEL WITH GFR
ALT: 26 U/L (ref 6–29)
AST: 22 U/L (ref 10–35)
BUN/Creatinine Ratio: 17 (calc) (ref 6–22)
CO2: 27 mmol/L (ref 20–32)
Chloride: 98 mmol/L (ref 98–110)
Globulin: 2.7 g/dL (ref 1.9–3.7)
Glucose, Bld: 225 mg/dL — ABNORMAL HIGH (ref 65–139)

## 2017-12-27 LAB — TEST AUTHORIZATION

## 2017-12-27 LAB — PROTEIN ELECTROPHORESIS, SERUM, WITH REFLEX
Albumin ELP: 3.5 g/dL — ABNORMAL LOW (ref 3.8–4.8)
Alpha 1: 0.5 g/dL — ABNORMAL HIGH (ref 0.2–0.3)
Alpha 2: 0.9 g/dL (ref 0.5–0.9)
Beta 2: 0.3 g/dL (ref 0.2–0.5)
Beta Globulin: 0.5 g/dL (ref 0.4–0.6)
Gamma Globulin: 0.9 g/dL (ref 0.8–1.7)
Total Protein: 6.5 g/dL (ref 6.1–8.1)

## 2017-12-27 LAB — TSH: TSH: 3.3 mIU/L (ref 0.40–4.50)

## 2017-12-27 MED ORDER — ALBUTEROL SULFATE (2.5 MG/3ML) 0.083% IN NEBU
2.50 | INHALATION_SOLUTION | RESPIRATORY_TRACT | Status: DC
Start: ? — End: 2017-12-27

## 2017-12-27 MED ORDER — ACETAMINOPHEN 650 MG RE SUPP
650.00 | RECTAL | Status: DC
Start: ? — End: 2017-12-27

## 2017-12-27 MED ORDER — HYDRALAZINE HCL 20 MG/ML IJ SOLN
10.00 | INTRAMUSCULAR | Status: DC
Start: ? — End: 2017-12-27

## 2017-12-27 MED ORDER — GENERIC EXTERNAL MEDICATION
Status: DC
Start: 2017-12-27 — End: 2017-12-27

## 2017-12-27 MED ORDER — KETOTIFEN FUMARATE 0.025 % OP SOLN
1.00 | OPHTHALMIC | Status: DC
Start: ? — End: 2017-12-27

## 2017-12-27 MED ORDER — GENERIC EXTERNAL MEDICATION
Status: DC
Start: ? — End: 2017-12-27

## 2017-12-27 MED ORDER — HYDROMORPHONE HCL 1 MG/ML IJ SOLN
.50 | INTRAMUSCULAR | Status: DC
Start: ? — End: 2017-12-27

## 2017-12-27 MED ORDER — FENTANYL CITRATE-NACL 2.5-0.9 MG/250ML-% IV SOLN
INTRAVENOUS | Status: DC
Start: ? — End: 2017-12-27

## 2017-12-27 MED ORDER — ONDANSETRON HCL 4 MG/2ML IJ SOLN
4.00 | INTRAMUSCULAR | Status: DC
Start: ? — End: 2017-12-27

## 2017-12-27 MED ORDER — LORAZEPAM 2 MG/ML IJ SOLN
.50 | INTRAMUSCULAR | Status: DC
Start: ? — End: 2017-12-27

## 2017-12-27 MED ORDER — SODIUM CHLORIDE 0.9 % IV SOLN
10.00 | INTRAVENOUS | Status: DC
Start: ? — End: 2017-12-27

## 2017-12-27 MED ORDER — LIDOCAINE-MENTHOL 3.6-1.25 % EX PTCH
1.00 | MEDICATED_PATCH | CUTANEOUS | Status: DC
Start: 2017-12-27 — End: 2017-12-27

## 2017-12-27 NOTE — Telephone Encounter (Signed)
Called and spoke with her daughter Izora Gala.  She unfortunately is not doing well and has had a stroke and is in the ICU.  They are giving her a couple of days to live

## 2018-01-13 ENCOUNTER — Ambulatory Visit: Payer: Medicare HMO | Admitting: Sports Medicine

## 2018-01-23 DEATH — deceased

## 2018-03-19 ENCOUNTER — Ambulatory Visit: Payer: Medicare HMO | Admitting: Family Medicine

## 2020-05-01 IMAGING — DX DG LUMBAR SPINE COMPLETE 4+V
5 series · 5 of 5 positions shown · non-contrast
Comparison: 12/05/2017 sacral films and hip films.

CLINICAL DATA: [AGE] female fell 3 weeks ago. Right-sided low
back pain extending to right leg. Subsequent encounter.

EXAM:
LUMBAR SPINE - COMPLETE 4+ VIEW

[l-spine ap]
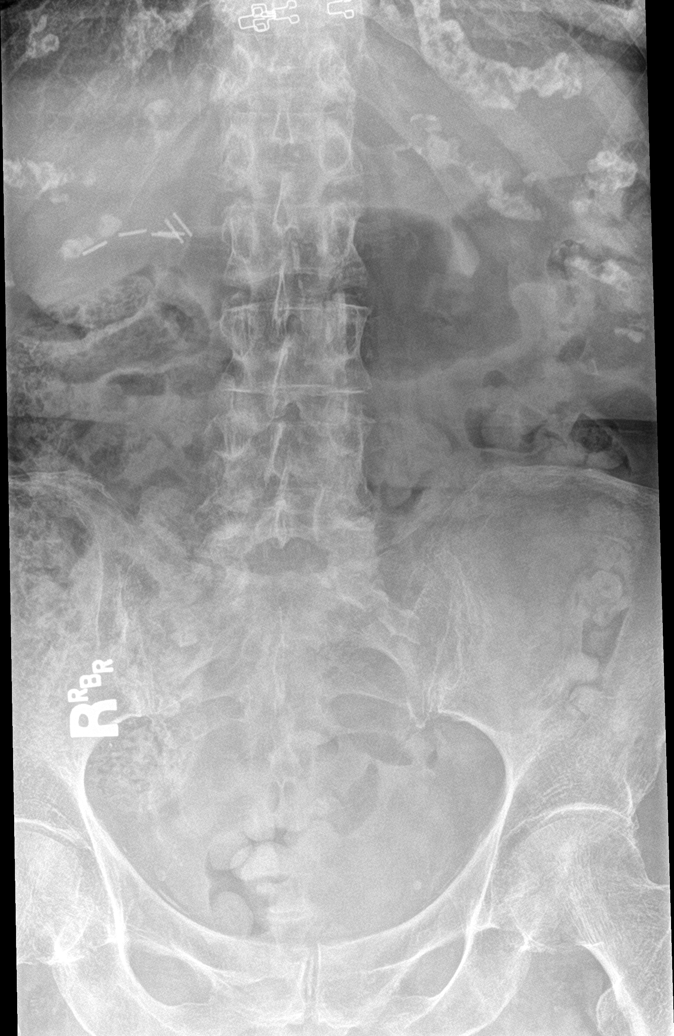

[l-spine obl (1 of 2)]
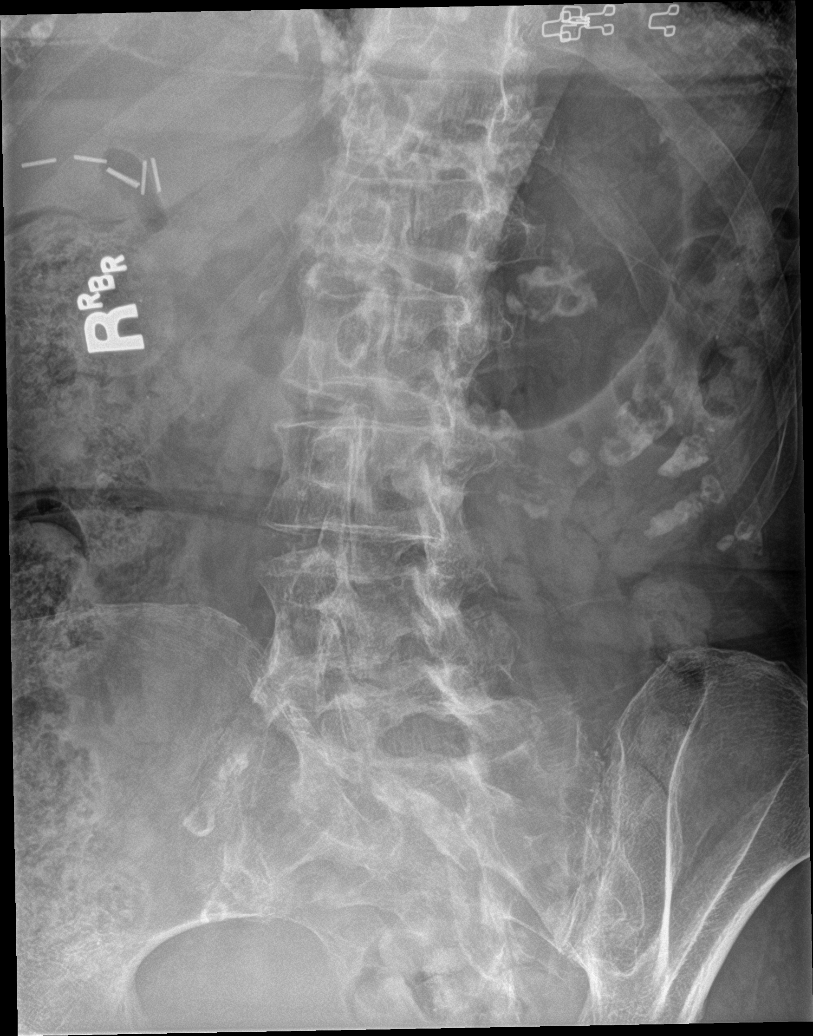

[l-spine obl (2 of 2)]
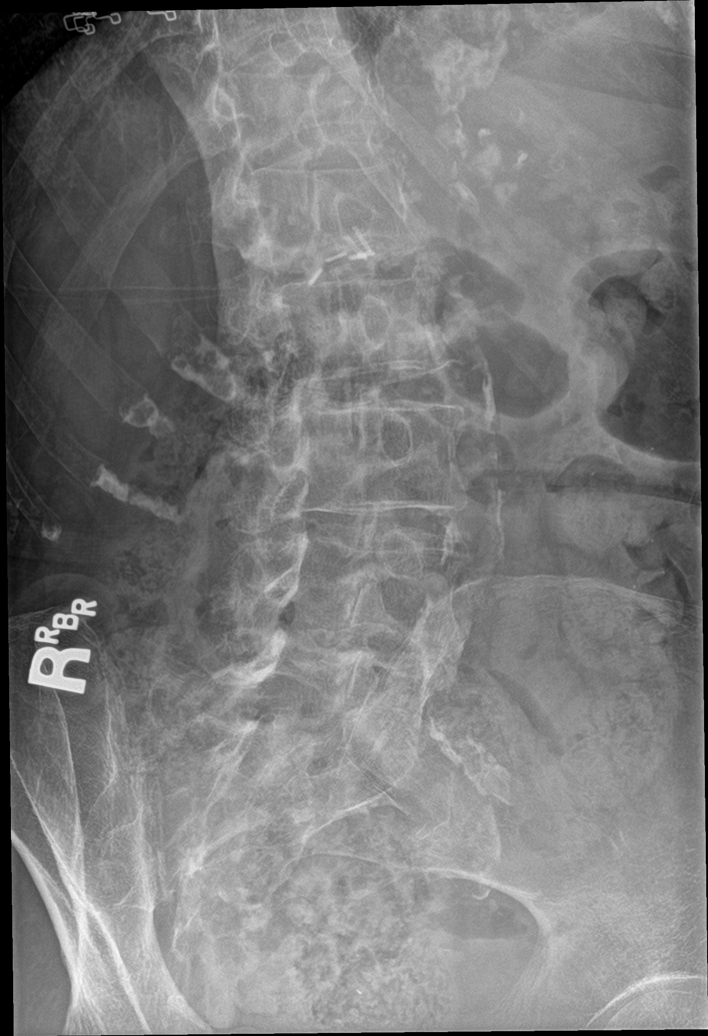

[l-spine lat]
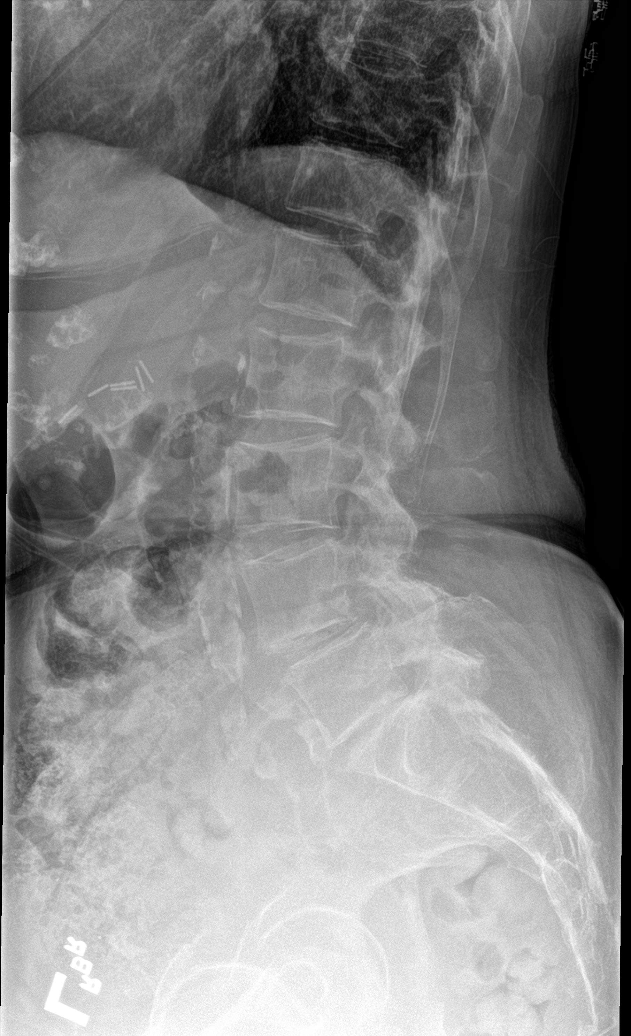

[l-spine spot]
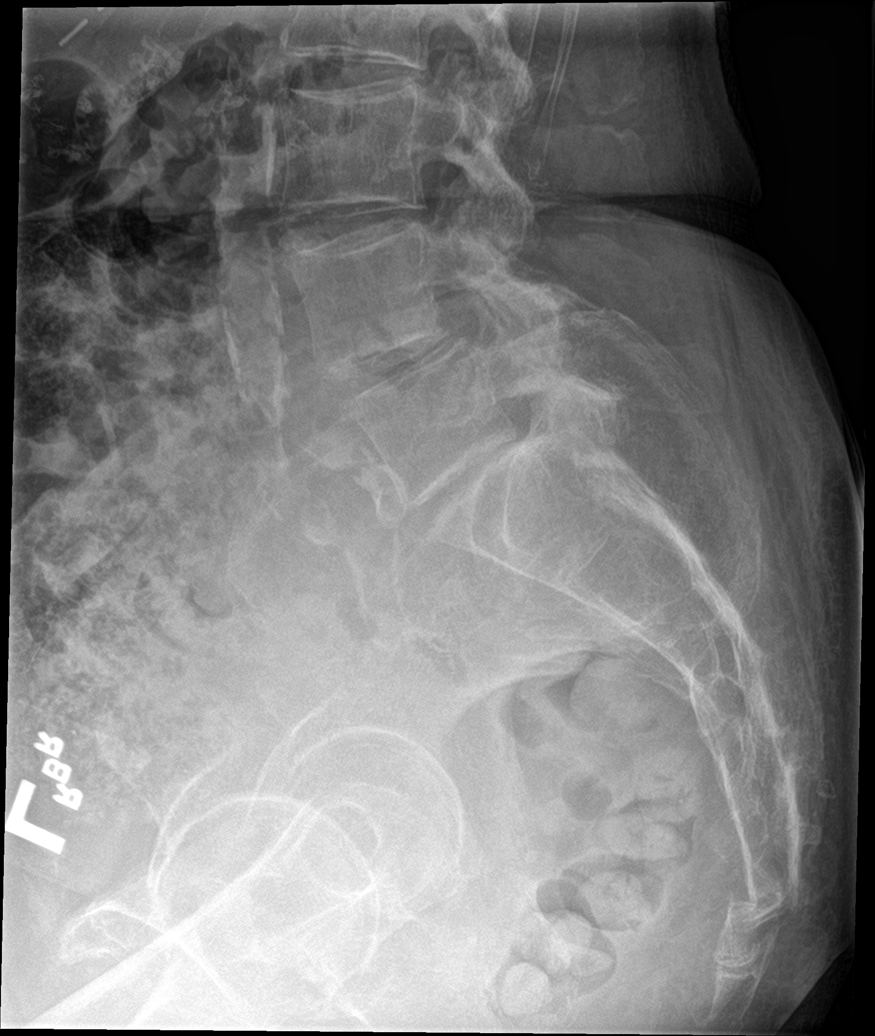

[5 of 5 positions shown; findings below may reference images not displayed]

FINDINGS: Buckle fracture of the coccyx similar to prior exam.

No lumbar compression fracture detected.

L4-5 and L5-S1 mild to moderate disc space narrowing. Minimal
anterior slip L4 felt to be related to facet degenerative changes.

No pars defect noted.

Moderate stool throughout the colon.

Post cholecystectomy.

Vascular calcifications.
IMPRESSION: Buckle fracture of the coccyx similar to prior exam.

No lumbar spine fracture detected.

L4-5 and L5-S1 mild to moderate disc space narrowing. Minimal
anterior slip L4.
